# Patient Record
Sex: Male | Born: 1965 | Race: Black or African American | Hispanic: No | Marital: Single | Smoking: Current every day smoker
Health system: Southern US, Community
[De-identification: ages and names within clinical notes are randomized; demographics above are authoritative.]

## PROBLEM LIST (undated history)

## (undated) DIAGNOSIS — R569 Unspecified convulsions: Secondary | ICD-10-CM

## (undated) DIAGNOSIS — J45909 Unspecified asthma, uncomplicated: Secondary | ICD-10-CM

## (undated) DIAGNOSIS — F410 Panic disorder [episodic paroxysmal anxiety] without agoraphobia: Secondary | ICD-10-CM

## (undated) DIAGNOSIS — F419 Anxiety disorder, unspecified: Secondary | ICD-10-CM

## (undated) DIAGNOSIS — F32A Depression, unspecified: Secondary | ICD-10-CM

## (undated) DIAGNOSIS — E785 Hyperlipidemia, unspecified: Secondary | ICD-10-CM

## (undated) DIAGNOSIS — K219 Gastro-esophageal reflux disease without esophagitis: Secondary | ICD-10-CM

## (undated) DIAGNOSIS — I10 Essential (primary) hypertension: Secondary | ICD-10-CM

## (undated) DIAGNOSIS — F329 Major depressive disorder, single episode, unspecified: Secondary | ICD-10-CM

## (undated) HISTORY — DX: Anxiety disorder, unspecified: F41.9

## (undated) HISTORY — DX: Hyperlipidemia, unspecified: E78.5

## (undated) HISTORY — DX: Gastro-esophageal reflux disease without esophagitis: K21.9

---

## 1998-07-25 ENCOUNTER — Emergency Department (HOSPITAL_COMMUNITY): Admission: EM | Admit: 1998-07-25 | Discharge: 1998-07-25 | Payer: Self-pay | Admitting: Emergency Medicine

## 1998-07-25 ENCOUNTER — Encounter: Payer: Self-pay | Admitting: *Deleted

## 1999-02-24 ENCOUNTER — Encounter: Payer: Self-pay | Admitting: Emergency Medicine

## 1999-02-24 ENCOUNTER — Emergency Department (HOSPITAL_COMMUNITY): Admission: EM | Admit: 1999-02-24 | Discharge: 1999-02-24 | Payer: Self-pay | Admitting: Emergency Medicine

## 2011-10-02 ENCOUNTER — Emergency Department (HOSPITAL_COMMUNITY)
Admission: EM | Admit: 2011-10-02 | Discharge: 2011-10-03 | Disposition: A | Discharge status: Home / Self Care | Payer: Medicare Other | Attending: Emergency Medicine | Admitting: Emergency Medicine

## 2011-10-02 ENCOUNTER — Encounter (HOSPITAL_COMMUNITY): Payer: Self-pay | Admitting: Family Medicine

## 2011-10-02 DIAGNOSIS — E119 Type 2 diabetes mellitus without complications: Secondary | ICD-10-CM | POA: Insufficient documentation

## 2011-10-02 DIAGNOSIS — J45909 Unspecified asthma, uncomplicated: Secondary | ICD-10-CM | POA: Insufficient documentation

## 2011-10-02 DIAGNOSIS — R1310 Dysphagia, unspecified: Secondary | ICD-10-CM

## 2011-10-02 DIAGNOSIS — I10 Essential (primary) hypertension: Secondary | ICD-10-CM | POA: Insufficient documentation

## 2011-10-02 DIAGNOSIS — F17200 Nicotine dependence, unspecified, uncomplicated: Secondary | ICD-10-CM | POA: Insufficient documentation

## 2011-10-02 DIAGNOSIS — R1110 Vomiting, unspecified: Secondary | ICD-10-CM | POA: Insufficient documentation

## 2011-10-02 HISTORY — DX: Depression, unspecified: F32.A

## 2011-10-02 HISTORY — DX: Panic disorder (episodic paroxysmal anxiety): F41.0

## 2011-10-02 HISTORY — DX: Major depressive disorder, single episode, unspecified: F32.9

## 2011-10-02 HISTORY — DX: Unspecified asthma, uncomplicated: J45.909

## 2011-10-02 HISTORY — DX: Essential (primary) hypertension: I10

## 2011-10-02 HISTORY — DX: Unspecified convulsions: R56.9

## 2011-10-02 LAB — COMPREHENSIVE METABOLIC PANEL
ALT: 14 U/L (ref 0–53)
AST: 17 U/L (ref 0–37)
Albumin: 4.1 g/dL (ref 3.5–5.2)
Alkaline Phosphatase: 49 U/L (ref 39–117)
BUN: 10 mg/dL (ref 6–23)
CO2: 25 mEq/L (ref 19–32)
Calcium: 9.6 mg/dL (ref 8.4–10.5)
Chloride: 106 mEq/L (ref 96–112)
Creatinine, Ser: 0.86 mg/dL (ref 0.50–1.35)
GFR calc Af Amer: >90 mL/min (ref >90)
GFR calc non Af Amer: >90 mL/min (ref >90)
Glucose, Bld: 90 mg/dL (ref 70–99)
Potassium: 3.9 mEq/L (ref 3.5–5.1)
Sodium: 142 mEq/L (ref 135–145)
Total Bilirubin: 0.2 mg/dL — ABNORMAL LOW (ref 0.3–1.2)
Total Protein: 7.5 g/dL (ref 6.0–8.3)

## 2011-10-02 LAB — CBC WITH DIFFERENTIAL/PLATELET
Basophils Absolute: 0 10*3/uL (ref 0.0–0.1)
Basophils Relative: 0 % (ref 0–1)
Eosinophils Absolute: 0.3 10*3/uL (ref 0.0–0.7)
Eosinophils Relative: 2 % (ref 0–5)
HCT: 43.5 % (ref 39.0–52.0)
Hemoglobin: 15.1 g/dL (ref 13.0–17.0)
Lymphocytes Relative: 44 % (ref 12–46)
Lymphs Abs: 5.5 10*3/uL — ABNORMAL HIGH (ref 0.7–4.0)
MCH: 33.1 pg (ref 26.0–34.0)
MCHC: 34.7 g/dL (ref 30.0–36.0)
MCV: 95.4 fL (ref 78.0–100.0)
Monocytes Absolute: 0.7 10*3/uL (ref 0.1–1.0)
Monocytes Relative: 6 % (ref 3–12)
Neutro Abs: 5.8 10*3/uL (ref 1.7–7.7)
Neutrophils Relative %: 47 % (ref 43–77)
Platelets: 240 10*3/uL (ref 150–400)
RBC: 4.56 MIL/uL (ref 4.22–5.81)
RDW: 12.9 % (ref 11.5–15.5)
WBC: 12.3 10*3/uL — ABNORMAL HIGH (ref 4.0–10.5)

## 2011-10-02 MED ORDER — PANTOPRAZOLE SODIUM 20 MG PO TBEC: 20.0000 mg | DELAYED_RELEASE_TABLET | Freq: Every day | ORAL | Status: AC

## 2011-10-02 MED ORDER — GI COCKTAIL ~~LOC~~
30.0000 mL | Freq: Once | ORAL | Status: AC
  Administered 2011-10-02: 30 mL via ORAL
  Filled 2011-10-02: qty 30

## 2011-10-02 MED ORDER — ONDANSETRON 4 MG PO TBDP
4.0000 mg | ORAL_TABLET | Freq: Once | ORAL | Status: AC
  Administered 2011-10-02: 4 mg via ORAL
  Filled 2011-10-02: qty 1

## 2011-10-02 MED ORDER — METOCLOPRAMIDE HCL 10 MG PO TABS: 10.0000 mg | ORAL_TABLET | Freq: Four times a day (QID) | ORAL | Status: AC

## 2011-10-02 NOTE — ED Notes (Signed)
Per pt he has had epigastric pain for a while but worse over the past week. sts every time he takes a sip of water or any food he vomits it back up. sts BM normal.

## 2011-10-02 NOTE — ED Provider Notes (Signed)
History     CSN: [id number]  Arrival date & time 10/02/11  1158   First MD Initiated Contact with Patient 10/02/11 1414      Patient reports for the last 3 weeks has had increasing nausea and vomiting. Reports everything he eats or drinks is causing to become nauseous and vomit. States now he is also having associated epigastric pain. Reports he is not even able to drink water. Denies currently having abdominal pain, fever, urinary symptoms, diarrhea, back pain, chest pain, shortness of breath. Denies significant history of abdominal illness this or surgeries Patient is a 46 y.o. male presenting with vomiting. The history is provided by the patient.  Emesis  This is a new problem. Episode onset: 3 weeks ago. Episode frequency: every time he eats or drinks. The problem has been gradually worsening. There has been no fever. Associated symptoms include abdominal pain. Pertinent negatives include no chills, no cough, no diarrhea, no fever, no headaches and no URI.    Past Medical History  Diagnosis Date  . Hypertension   . Diabetes mellitus   . Seizures   . Panic attack   . Depression   . Asthma     History reviewed. No pertinent past surgical history.  History reviewed. No pertinent family history.  History  Substance Use Topics  . Smoking status: Current Everyday Smoker  . Smokeless tobacco: Not on file  . Alcohol Use: No      Review of Systems  Constitutional: Negative for fever and chills.  Respiratory: Negative for cough and shortness of breath.   Cardiovascular: Negative for chest pain.  Gastrointestinal: Positive for nausea, vomiting and abdominal pain. Negative for diarrhea, constipation, blood in stool and rectal pain.  Genitourinary: Negative for dysuria, hematuria, flank pain, discharge, penile pain and testicular pain.  Musculoskeletal: Negative for back pain.  Neurological: Negative for dizziness, weakness, numbness and headaches.  All other systems reviewed and  are negative.    Allergies  Penicillins  Home Medications   Current Outpatient Rx  Name Route Sig Dispense Refill  . ALBUTEROL SULFATE HFA 108 (90 BASE) MCG/ACT IN AERS Inhalation Inhale 2 puffs into the lungs every 6 (six) hours as needed.    Marland Kitchen CARVEDILOL PO Oral Take 1 tablet by mouth 2 (two) times daily.    . ENALAPRIL MALEATE PO Oral Take 1 tablet by mouth daily.    Marland Kitchen ADVAIR DISKUS IN Inhalation Inhale 1 puff into the lungs every morning.    Marland Kitchen HYDROCHLOROTHIAZIDE PO Oral Take 1 tablet by mouth daily.    Marland Kitchen METFORMIN HCL PO Oral Take 1 tablet by mouth daily.    Marland Kitchen OVER THE COUNTER MEDICATION Oral Take 2 tablets by mouth daily as needed. For Acid reflux    . PHENYTOIN SODIUM EXTENDED 100 MG PO CAPS Oral Take 300 mg by mouth 2 (two) times daily.    Marland Kitchen PRESCRIPTION MEDICATION Oral Take 1 tablet by mouth 2 (two) times daily as needed. For panic attacks    . PRESCRIPTION MEDICATION Oral Take 1 tablet by mouth daily.      BP 143/91  Pulse 84  Temp 98.2 F (36.8 C) (Oral)  Resp 14  SpO2 98%  Physical Exam  Vitals reviewed. Constitutional: He is oriented to person, place, and time. He appears well-developed and well-nourished.  HENT:  Head: Normocephalic and atraumatic.  Eyes: Conjunctivae are normal. Pupils are equal, round, and reactive to light.  Neck: Normal range of motion. Neck supple.  Cardiovascular: Normal rate,  regular rhythm and normal heart sounds.   Pulmonary/Chest: Effort normal and breath sounds normal.  Abdominal: Soft. Bowel sounds are normal. He exhibits no distension and no mass. There is no tenderness. There is no rebound and no guarding.  Neurological: He is alert and oriented to person, place, and time.  Skin: Skin is warm and dry. No rash noted. No erythema. No pallor.  Psychiatric: He has a normal mood and affect. His behavior is normal.    ED Course  Procedures  Results for orders placed during the hospital encounter of 10/02/11  CBC WITH DIFFERENTIAL        Component Value Range   WBC 12.3 (*) 4.0 - 10.5 K/uL   RBC 4.56  4.22 - 5.81 MIL/uL   Hemoglobin 15.1  13.0 - 17.0 g/dL   HCT 40.9  81.1 - 91.4 %   MCV 95.4  78.0 - 100.0 fL   MCH 33.1  26.0 - 34.0 pg   MCHC 34.7  30.0 - 36.0 g/dL   RDW 78.2  95.6 - 21.3 %   Platelets 240  150 - 400 K/uL   Neutrophils Relative 47  43 - 77 %   Neutro Abs 5.8  1.7 - 7.7 K/uL   Lymphocytes Relative 44  12 - 46 %   Lymphs Abs 5.5 (*) 0.7 - 4.0 K/uL   Monocytes Relative 6  3 - 12 %   Monocytes Absolute 0.7  0.1 - 1.0 K/uL   Eosinophils Relative 2  0 - 5 %   Eosinophils Absolute 0.3  0.0 - 0.7 K/uL   Basophils Relative 0  0 - 1 %   Basophils Absolute 0.0  0.0 - 0.1 K/uL  COMPREHENSIVE METABOLIC PANEL      Component Value Range   Sodium 142  135 - 145 mEq/L   Potassium 3.9  3.5 - 5.1 mEq/L   Chloride 106  96 - 112 mEq/L   CO2 25  19 - 32 mEq/L   Glucose, Bld 90  70 - 99 mg/dL   BUN 10  6 - 23 mg/dL   Creatinine, Ser 0.86  0.50 - 1.35 mg/dL   Calcium 9.6  8.4 - 57.8 mg/dL   Total Protein 7.5  6.0 - 8.3 g/dL   Albumin 4.1  3.5 - 5.2 g/dL   AST 17  0 - 37 U/L   ALT 14  0 - 53 U/L   Alkaline Phosphatase 49  39 - 117 U/L   Total Bilirubin 0.2 (*) 0.3 - 1.2 mg/dL   GFR calc non Af Amer >90  >90 mL/min   GFR calc Af Amer >90  >90 mL/min  LIPASE, BLOOD      Component Value Range   Lipase 24  11 - 59 U/L    MDM     Spoke with Dr. Rhea Belton, he also agrees that patient likely needs an upper endoscopy. Recommends patient be placed in a PPI and have close followup with gastroenterology. Discussed this with patient he voices understanding and is ready for discharge. Advised return if unable to tolerate by mouth fluids. Po trialed and passed     Thomasene Lot, PA-C 10/02/11 1546

## 2011-10-03 NOTE — ED Notes (Signed)
See downtime charting. 

## 2011-10-03 NOTE — ED Provider Notes (Signed)
Medical screening examination/treatment/procedure(s) were performed by non-physician practitioner and as supervising physician I was immediately available for consultation/collaboration.  Juliet Rude. Rubin Payor, MD 10/03/11 1356

## 2011-10-04 ENCOUNTER — Telehealth: Payer: Self-pay | Admitting: *Deleted

## 2011-10-05 NOTE — Telephone Encounter (Signed)
Per Dr Rhea Belton, pt came to the ER and has no GI doc; needs to be established with a doc in our office. I tried all the numbers listed for pt and contacts and they were all out of service or the person who answered did not know the pt. I will send a letter.

## 2011-10-21 ENCOUNTER — Encounter: Payer: Self-pay | Admitting: Gastroenterology

## 2011-11-11 ENCOUNTER — Ambulatory Visit: Payer: Medicare Other | Admitting: Gastroenterology

## 2011-11-11 ENCOUNTER — Telehealth: Payer: Self-pay | Admitting: *Deleted

## 2011-11-11 NOTE — Telephone Encounter (Signed)
Message copied by Arna Snipe on Fri Nov 11, 2011  3:26 PM ------      Message from: Florene Glen      Created: Fri Nov 11, 2011  8:37 AM      Regarding: RE: Question?  This pt can only see Dr Rhea Belton?       Yes, Dr Rhea Belton states he can reestablish with any doc here. Thanks.      ----- Message -----         From: Arna Snipe         Sent: 11/09/2011   2:27 PM           To: Linna Hoff, RN      Subject: Question?  This pt can only see Dr Rhea Belton?

## 2015-09-30 DIAGNOSIS — I152 Hypertension secondary to endocrine disorders: Secondary | ICD-10-CM | POA: Insufficient documentation

## 2015-09-30 DIAGNOSIS — E119 Type 2 diabetes mellitus without complications: Secondary | ICD-10-CM | POA: Insufficient documentation

## 2018-01-22 DIAGNOSIS — F17200 Nicotine dependence, unspecified, uncomplicated: Secondary | ICD-10-CM | POA: Insufficient documentation

## 2021-01-21 ENCOUNTER — Other Ambulatory Visit (HOSPITAL_COMMUNITY): Payer: Self-pay | Admitting: Family Medicine

## 2021-01-21 ENCOUNTER — Other Ambulatory Visit (HOSPITAL_BASED_OUTPATIENT_CLINIC_OR_DEPARTMENT_OTHER): Payer: Self-pay | Admitting: Family Medicine

## 2021-01-21 DIAGNOSIS — F17210 Nicotine dependence, cigarettes, uncomplicated: Secondary | ICD-10-CM

## 2021-04-18 ENCOUNTER — Emergency Department (HOSPITAL_COMMUNITY): Payer: Medicare Other

## 2021-04-18 ENCOUNTER — Inpatient Hospital Stay (HOSPITAL_COMMUNITY)
Admission: EM | Admit: 2021-04-18 | Discharge: 2021-04-19 | DRG: 024 | Discharge status: Against Medical Advice | Payer: Medicare Other | Attending: Neurology | Admitting: Neurology

## 2021-04-18 ENCOUNTER — Emergency Department (HOSPITAL_COMMUNITY): Payer: Medicare Other | Admitting: Anesthesiology

## 2021-04-18 ENCOUNTER — Encounter (HOSPITAL_COMMUNITY)
Admission: EM | Discharge status: Against Medical Advice | Payer: Self-pay | Source: Home / Self Care | Attending: Neurology

## 2021-04-18 ENCOUNTER — Inpatient Hospital Stay (HOSPITAL_COMMUNITY): Payer: Medicare Other

## 2021-04-18 DIAGNOSIS — I679 Cerebrovascular disease, unspecified: Secondary | ICD-10-CM | POA: Diagnosis not present

## 2021-04-18 DIAGNOSIS — I6521 Occlusion and stenosis of right carotid artery: Secondary | ICD-10-CM | POA: Diagnosis not present

## 2021-04-18 DIAGNOSIS — I639 Cerebral infarction, unspecified: Secondary | ICD-10-CM

## 2021-04-18 DIAGNOSIS — R29810 Facial weakness: Secondary | ICD-10-CM | POA: Diagnosis present

## 2021-04-18 DIAGNOSIS — W19XXXA Unspecified fall, initial encounter: Secondary | ICD-10-CM | POA: Diagnosis present

## 2021-04-18 DIAGNOSIS — I63139 Cerebral infarction due to embolism of unspecified carotid artery: Secondary | ICD-10-CM | POA: Diagnosis present

## 2021-04-18 DIAGNOSIS — G8194 Hemiplegia, unspecified affecting left nondominant side: Secondary | ICD-10-CM | POA: Diagnosis present

## 2021-04-18 DIAGNOSIS — K219 Gastro-esophageal reflux disease without esophagitis: Secondary | ICD-10-CM | POA: Diagnosis present

## 2021-04-18 DIAGNOSIS — R29717 NIHSS score 17: Secondary | ICD-10-CM | POA: Diagnosis present

## 2021-04-18 DIAGNOSIS — I63131 Cerebral infarction due to embolism of right carotid artery: Secondary | ICD-10-CM

## 2021-04-18 DIAGNOSIS — E7800 Pure hypercholesterolemia, unspecified: Secondary | ICD-10-CM | POA: Diagnosis not present

## 2021-04-18 DIAGNOSIS — Z7951 Long term (current) use of inhaled steroids: Secondary | ICD-10-CM

## 2021-04-18 DIAGNOSIS — Z7984 Long term (current) use of oral hypoglycemic drugs: Secondary | ICD-10-CM

## 2021-04-18 DIAGNOSIS — E785 Hyperlipidemia, unspecified: Secondary | ICD-10-CM | POA: Diagnosis present

## 2021-04-18 DIAGNOSIS — I6601 Occlusion and stenosis of right middle cerebral artery: Secondary | ICD-10-CM | POA: Diagnosis not present

## 2021-04-18 DIAGNOSIS — I63411 Cerebral infarction due to embolism of right middle cerebral artery: Secondary | ICD-10-CM | POA: Diagnosis not present

## 2021-04-18 DIAGNOSIS — Z20822 Contact with and (suspected) exposure to covid-19: Secondary | ICD-10-CM | POA: Diagnosis present

## 2021-04-18 DIAGNOSIS — E11649 Type 2 diabetes mellitus with hypoglycemia without coma: Secondary | ICD-10-CM | POA: Diagnosis not present

## 2021-04-18 DIAGNOSIS — F32A Depression, unspecified: Secondary | ICD-10-CM | POA: Diagnosis present

## 2021-04-18 DIAGNOSIS — Z716 Tobacco abuse counseling: Secondary | ICD-10-CM

## 2021-04-18 DIAGNOSIS — F17200 Nicotine dependence, unspecified, uncomplicated: Secondary | ICD-10-CM | POA: Diagnosis not present

## 2021-04-18 DIAGNOSIS — E119 Type 2 diabetes mellitus without complications: Secondary | ICD-10-CM | POA: Diagnosis present

## 2021-04-18 DIAGNOSIS — F419 Anxiety disorder, unspecified: Secondary | ICD-10-CM | POA: Diagnosis present

## 2021-04-18 DIAGNOSIS — I10 Essential (primary) hypertension: Secondary | ICD-10-CM | POA: Diagnosis present

## 2021-04-18 DIAGNOSIS — Z79899 Other long term (current) drug therapy: Secondary | ICD-10-CM | POA: Diagnosis not present

## 2021-04-18 DIAGNOSIS — R414 Neurologic neglect syndrome: Secondary | ICD-10-CM | POA: Diagnosis present

## 2021-04-18 DIAGNOSIS — R29710 NIHSS score 10: Secondary | ICD-10-CM | POA: Diagnosis not present

## 2021-04-18 DIAGNOSIS — Z880 Allergy status to penicillin: Secondary | ICD-10-CM | POA: Diagnosis not present

## 2021-04-18 DIAGNOSIS — F410 Panic disorder [episodic paroxysmal anxiety] without agoraphobia: Secondary | ICD-10-CM | POA: Diagnosis present

## 2021-04-18 DIAGNOSIS — R471 Dysarthria and anarthria: Secondary | ICD-10-CM | POA: Diagnosis present

## 2021-04-18 DIAGNOSIS — R451 Restlessness and agitation: Secondary | ICD-10-CM | POA: Diagnosis not present

## 2021-04-18 DIAGNOSIS — F17210 Nicotine dependence, cigarettes, uncomplicated: Secondary | ICD-10-CM | POA: Diagnosis present

## 2021-04-18 DIAGNOSIS — I63511 Cerebral infarction due to unspecified occlusion or stenosis of right middle cerebral artery: Secondary | ICD-10-CM

## 2021-04-18 DIAGNOSIS — I63311 Cerebral infarction due to thrombosis of right middle cerebral artery: Principal | ICD-10-CM | POA: Diagnosis present

## 2021-04-18 HISTORY — PX: RADIOLOGY WITH ANESTHESIA: SHX6223

## 2021-04-18 HISTORY — PX: IR CT HEAD LTD: IMG2386

## 2021-04-18 HISTORY — PX: IR PERCUTANEOUS ART THROMBECTOMY/INFUSION INTRACRANIAL INC DIAG ANGIO: IMG6087

## 2021-04-18 LAB — COMPREHENSIVE METABOLIC PANEL
ALT: 32 U/L (ref 0–44)
AST: 23 U/L (ref 15–41)
Albumin: 3.9 g/dL (ref 3.5–5.0)
Alkaline Phosphatase: 48 U/L (ref 38–126)
Anion gap: 9 (ref 5–15)
BUN: 14 mg/dL (ref 6–20)
CO2: 24 mmol/L (ref 22–32)
Calcium: 9.3 mg/dL (ref 8.9–10.3)
Chloride: 103 mmol/L (ref 98–111)
Creatinine, Ser: 1.11 mg/dL (ref 0.61–1.24)
GFR, Estimated: >60 mL/min (ref >60)
Glucose, Bld: 205 mg/dL — ABNORMAL HIGH (ref 70–99)
Potassium: 4 mmol/L (ref 3.5–5.1)
Sodium: 136 mmol/L (ref 135–145)
Total Bilirubin: 0.7 mg/dL (ref 0.3–1.2)
Total Protein: 7.5 g/dL (ref 6.5–8.1)

## 2021-04-18 LAB — DIFFERENTIAL
Abs Immature Granulocytes: 0.01 10*3/uL (ref 0.00–0.07)
Basophils Absolute: 0 10*3/uL (ref 0.0–0.1)
Basophils Relative: 1 %
Eosinophils Absolute: 0.2 10*3/uL (ref 0.0–0.5)
Eosinophils Relative: 3 %
Immature Granulocytes: 0 %
Lymphocytes Relative: 34 %
Lymphs Abs: 2.6 10*3/uL (ref 0.7–4.0)
Monocytes Absolute: 0.6 10*3/uL (ref 0.1–1.0)
Monocytes Relative: 8 %
Neutro Abs: 4.1 10*3/uL (ref 1.7–7.7)
Neutrophils Relative %: 54 %

## 2021-04-18 LAB — RESP PANEL BY RT-PCR (FLU A&B, COVID) ARPGX2
Influenza A by PCR: NEGATIVE
Influenza B by PCR: NEGATIVE
SARS Coronavirus 2 by RT PCR: NEGATIVE

## 2021-04-18 LAB — CBC
HCT: 43.9 % (ref 39.0–52.0)
Hemoglobin: 14.8 g/dL (ref 13.0–17.0)
MCH: 32.6 pg (ref 26.0–34.0)
MCHC: 33.7 g/dL (ref 30.0–36.0)
MCV: 96.7 fL (ref 80.0–100.0)
Platelets: 203 10*3/uL (ref 150–400)
RBC: 4.54 MIL/uL (ref 4.22–5.81)
RDW: 12.5 % (ref 11.5–15.5)
WBC: 7.5 10*3/uL (ref 4.0–10.5)
nRBC: 0 % (ref 0.0–0.2)

## 2021-04-18 LAB — URINALYSIS, DIPSTICK ONLY
Bilirubin Urine: NEGATIVE
Glucose, UA: 250 mg/dL — AB
Hgb urine dipstick: NEGATIVE
Ketones, ur: NEGATIVE mg/dL
Leukocytes,Ua: NEGATIVE
Nitrite: NEGATIVE
Protein, ur: NEGATIVE mg/dL
Specific Gravity, Urine: <1.005 — ABNORMAL LOW (ref 1.005–1.030)
pH: 8 (ref 5.0–8.0)

## 2021-04-18 LAB — APTT: aPTT: 30 seconds (ref 24–36)

## 2021-04-18 LAB — GLUCOSE, CAPILLARY: Glucose-Capillary: 142 mg/dL — ABNORMAL HIGH (ref 70–99)

## 2021-04-18 LAB — PHOSPHORUS: Phosphorus: 2.5 mg/dL (ref 2.5–4.6)

## 2021-04-18 LAB — PROTIME-INR
INR: 1 (ref 0.8–1.2)
Prothrombin Time: 13.4 seconds (ref 11.4–15.2)

## 2021-04-18 LAB — CBG MONITORING, ED: Glucose-Capillary: 207 mg/dL — ABNORMAL HIGH (ref 70–99)

## 2021-04-18 LAB — I-STAT CHEM 8, ED
BUN: 17 mg/dL (ref 6–20)
Calcium, Ion: 1.03 mmol/L — ABNORMAL LOW (ref 1.15–1.40)
Chloride: 102 mmol/L (ref 98–111)
Creatinine, Ser: 1 mg/dL (ref 0.61–1.24)
Glucose, Bld: 204 mg/dL — ABNORMAL HIGH (ref 70–99)
HCT: 46 % (ref 39.0–52.0)
Hemoglobin: 15.6 g/dL (ref 13.0–17.0)
Potassium: 3.9 mmol/L (ref 3.5–5.1)
Sodium: 136 mmol/L (ref 135–145)
TCO2: 26 mmol/L (ref 22–32)

## 2021-04-18 LAB — ETHANOL: Alcohol, Ethyl (B): <10 mg/dL (ref <10)

## 2021-04-18 LAB — MRSA NEXT GEN BY PCR, NASAL: MRSA by PCR Next Gen: NOT DETECTED

## 2021-04-18 LAB — PHENYTOIN LEVEL, TOTAL: Phenytoin Lvl: <2.5 ug/mL — ABNORMAL LOW (ref 10.0–20.0)

## 2021-04-18 LAB — MAGNESIUM: Magnesium: 1.8 mg/dL (ref 1.7–2.4)

## 2021-04-18 SURGERY — RADIOLOGY WITH ANESTHESIA
Anesthesia: General

## 2021-04-18 MED ORDER — IOHEXOL 350 MG/ML SOLN
40.0000 mL | Freq: Once | INTRAVENOUS | Status: AC | PRN
  Administered 2021-04-18: 40 mL via INTRAVENOUS

## 2021-04-18 MED ORDER — CLEVIDIPINE BUTYRATE 0.5 MG/ML IV EMUL
INTRAVENOUS | Status: DC | PRN
  Administered 2021-04-18: 2 mg/h via INTRAVENOUS

## 2021-04-18 MED ORDER — SUCCINYLCHOLINE CHLORIDE 200 MG/10ML IV SOSY
PREFILLED_SYRINGE | INTRAVENOUS | Status: DC | PRN
Start: 2021-04-18 — End: 2021-04-18
  Administered 2021-04-18: 120 mg via INTRAVENOUS

## 2021-04-18 MED ORDER — ONDANSETRON HCL 4 MG/2ML IJ SOLN
INTRAMUSCULAR | Status: AC
  Administered 2021-04-18: 4 mg via INTRAVENOUS
  Filled 2021-04-18: qty 2

## 2021-04-18 MED ORDER — PHENYLEPHRINE HCL-NACL 20-0.9 MG/250ML-% IV SOLN
INTRAVENOUS | Status: DC | PRN
  Administered 2021-04-18: 30 ug/min via INTRAVENOUS

## 2021-04-18 MED ORDER — GLYCOPYRROLATE 0.2 MG/ML IJ SOLN
INTRAMUSCULAR | Status: DC | PRN
  Administered 2021-04-18: .2 mg via INTRAVENOUS

## 2021-04-18 MED ORDER — ONDANSETRON HCL 4 MG/2ML IJ SOLN
INTRAMUSCULAR | Status: DC | PRN
Start: 2021-04-18 — End: 2021-04-18
  Administered 2021-04-18: 4 mg via INTRAVENOUS

## 2021-04-18 MED ORDER — ACETAMINOPHEN 650 MG RE SUPP: 650.0000 mg | RECTAL | Status: DC | PRN

## 2021-04-18 MED ORDER — CANGRELOR TETRASODIUM 50 MG IV SOLR
INTRAVENOUS | Status: AC
  Filled 2021-04-18: qty 50

## 2021-04-18 MED ORDER — PROPOFOL 10 MG/ML IV BOLUS
INTRAVENOUS | Status: DC | PRN
  Administered 2021-04-18: 130 mg via INTRAVENOUS

## 2021-04-18 MED ORDER — VANCOMYCIN HCL IN DEXTROSE 1-5 GM/200ML-% IV SOLN
INTRAVENOUS | Status: AC
  Filled 2021-04-18: qty 200

## 2021-04-18 MED ORDER — IOHEXOL 300 MG/ML  SOLN
100.0000 mL | Freq: Once | INTRAMUSCULAR | Status: AC | PRN
  Administered 2021-04-18: 50 mL via INTRA_ARTERIAL

## 2021-04-18 MED ORDER — SODIUM CHLORIDE (PF) 0.9 % IJ SOLN
INTRAVENOUS | Status: DC | PRN
  Administered 2021-04-18 (×4): 25 ug via INTRA_ARTERIAL

## 2021-04-18 MED ORDER — TICAGRELOR 90 MG PO TABS: 90.0000 mg | ORAL_TABLET | Freq: Two times a day (BID) | ORAL | Status: DC

## 2021-04-18 MED ORDER — ASPIRIN 81 MG PO CHEW
81.0000 mg | CHEWABLE_TABLET | Freq: Every day | ORAL | Status: DC
  Administered 2021-04-19: 81 mg

## 2021-04-18 MED ORDER — LABETALOL HCL 5 MG/ML IV SOLN
INTRAVENOUS | Status: DC | PRN
  Administered 2021-04-18 (×2): 10 mg via INTRAVENOUS

## 2021-04-18 MED ORDER — ACETAMINOPHEN 160 MG/5ML PO SOLN: 650.0000 mg | ORAL | Status: DC | PRN

## 2021-04-18 MED ORDER — IOHEXOL 300 MG/ML  SOLN
100.0000 mL | Freq: Once | INTRAMUSCULAR | Status: AC | PRN
  Administered 2021-04-18: 60 mL via INTRA_ARTERIAL

## 2021-04-18 MED ORDER — LABETALOL HCL 5 MG/ML IV SOLN
10.0000 mg | INTRAVENOUS | Status: DC | PRN
  Administered 2021-04-18 (×2): 10 mg via INTRAVENOUS
  Filled 2021-04-18 (×2): qty 4

## 2021-04-18 MED ORDER — SODIUM CHLORIDE 0.9 % IV SOLN: INTRAVENOUS | Status: DC

## 2021-04-18 MED ORDER — ASPIRIN 325 MG PO TABS
ORAL_TABLET | ORAL | Status: DC | PRN
  Administered 2021-04-18: 81 mg via ORAL

## 2021-04-18 MED ORDER — LIDOCAINE 2% (20 MG/ML) 5 ML SYRINGE
INTRAMUSCULAR | Status: DC | PRN
  Administered 2021-04-18: 60 mg via INTRAVENOUS

## 2021-04-18 MED ORDER — ACETAMINOPHEN 325 MG PO TABS: 650.0000 mg | ORAL_TABLET | ORAL | Status: DC | PRN

## 2021-04-18 MED ORDER — SUGAMMADEX SODIUM 200 MG/2ML IV SOLN
INTRAVENOUS | Status: DC | PRN
  Administered 2021-04-18 (×2): 100 mg via INTRAVENOUS

## 2021-04-18 MED ORDER — NITROGLYCERIN 1 MG/10 ML FOR IR/CATH LAB
INTRA_ARTERIAL | Status: AC
  Filled 2021-04-18: qty 10

## 2021-04-18 MED ORDER — IOHEXOL 350 MG/ML SOLN
170.0000 mL | Freq: Once | INTRAVENOUS | Status: AC | PRN
  Administered 2021-04-18: 170 mL via INTRAVENOUS

## 2021-04-18 MED ORDER — LACTATED RINGERS IV SOLN: INTRAVENOUS | Status: DC | PRN

## 2021-04-18 MED ORDER — SODIUM CHLORIDE 0.9 % IV SOLN
INTRAVENOUS | Status: DC | PRN
  Administered 2021-04-18: 2 ug/kg/min via INTRAVENOUS

## 2021-04-18 MED ORDER — SODIUM CHLORIDE 0.9% FLUSH: 3.0000 mL | Freq: Once | INTRAVENOUS | Status: DC

## 2021-04-18 MED ORDER — CLEVIDIPINE BUTYRATE 0.5 MG/ML IV EMUL
0.0000 mg/h | INTRAVENOUS | Status: DC
  Administered 2021-04-18 (×3): 21 mg/h via INTRAVENOUS
  Administered 2021-04-19: 8 mg/h via INTRAVENOUS
  Administered 2021-04-19: 20 mg/h via INTRAVENOUS
  Administered 2021-04-19: 18 mg/h via INTRAVENOUS
  Administered 2021-04-19: 20 mg/h via INTRAVENOUS
  Filled 2021-04-18 (×3): qty 100
  Filled 2021-04-18: qty 50
  Filled 2021-04-18 (×3): qty 100

## 2021-04-18 MED ORDER — VANCOMYCIN HCL 1000 MG IV SOLR
INTRAVENOUS | Status: DC | PRN
  Administered 2021-04-18: 1000 mg via INTRAVENOUS

## 2021-04-18 MED ORDER — SENNOSIDES-DOCUSATE SODIUM 8.6-50 MG PO TABS
1.0000 | ORAL_TABLET | Freq: Two times a day (BID) | ORAL | Status: DC
  Filled 2021-04-18: qty 1

## 2021-04-18 MED ORDER — LACTATED RINGERS IV SOLN
INTRAVENOUS | Status: DC | PRN
Start: 2021-04-18 — End: 2021-04-18

## 2021-04-18 MED ORDER — CHLORHEXIDINE GLUCONATE CLOTH 2 % EX PADS: 6.0000 | MEDICATED_PAD | Freq: Every day | CUTANEOUS | Status: DC

## 2021-04-18 MED ORDER — STROKE: EARLY STAGES OF RECOVERY BOOK
Freq: Once | Status: AC
  Filled 2021-04-18: qty 1

## 2021-04-18 MED ORDER — FENTANYL CITRATE (PF) 250 MCG/5ML IJ SOLN
INTRAMUSCULAR | Status: DC | PRN
Start: 2021-04-18 — End: 2021-04-18
  Administered 2021-04-18 (×2): 50 ug via INTRAVENOUS

## 2021-04-18 MED ORDER — TICAGRELOR 90 MG PO TABS
90.0000 mg | ORAL_TABLET | Freq: Two times a day (BID) | ORAL | Status: DC
  Administered 2021-04-19: 90 mg via ORAL
  Filled 2021-04-18: qty 1

## 2021-04-18 MED ORDER — CLEVIDIPINE BUTYRATE 0.5 MG/ML IV EMUL
INTRAVENOUS | Status: AC
  Filled 2021-04-18: qty 50

## 2021-04-18 MED ORDER — TICAGRELOR 60 MG PO TABS
ORAL_TABLET | ORAL | Status: DC | PRN
  Administered 2021-04-18: 180 mg via ORAL

## 2021-04-18 MED ORDER — ASPIRIN 81 MG PO CHEW
CHEWABLE_TABLET | ORAL | Status: AC
  Filled 2021-04-18: qty 1

## 2021-04-18 MED ORDER — ROCURONIUM BROMIDE 10 MG/ML (PF) SYRINGE
PREFILLED_SYRINGE | INTRAVENOUS | Status: DC | PRN
  Administered 2021-04-18: 20 mg via INTRAVENOUS
  Administered 2021-04-18: 60 mg via INTRAVENOUS

## 2021-04-18 MED ORDER — CANGRELOR BOLUS VIA INFUSION
INTRAVENOUS | Status: DC | PRN
  Administered 2021-04-18: 1317 ug via INTRAVENOUS

## 2021-04-18 MED ORDER — HEPARIN SODIUM (PORCINE) 5000 UNIT/ML IJ SOLN
5000.0000 [IU] | Freq: Three times a day (TID) | INTRAMUSCULAR | Status: DC
  Administered 2021-04-18 – 2021-04-19 (×2): 5000 [IU] via SUBCUTANEOUS
  Filled 2021-04-18 (×3): qty 1

## 2021-04-18 MED ORDER — DEXAMETHASONE SODIUM PHOSPHATE 10 MG/ML IJ SOLN
INTRAMUSCULAR | Status: DC | PRN
Start: 2021-04-18 — End: 2021-04-18
  Administered 2021-04-18: 5 mg via INTRAVENOUS

## 2021-04-18 MED ORDER — ASPIRIN 81 MG PO CHEW
81.0000 mg | CHEWABLE_TABLET | Freq: Every day | ORAL | Status: DC
  Filled 2021-04-18: qty 1

## 2021-04-18 MED ORDER — TICAGRELOR 90 MG PO TABS
ORAL_TABLET | ORAL | Status: AC
  Filled 2021-04-18: qty 2

## 2021-04-18 MED ORDER — PHENYLEPHRINE HCL-NACL 20-0.9 MG/250ML-% IV SOLN: INTRAVENOUS | Status: DC | PRN

## 2021-04-18 MED ORDER — ONDANSETRON HCL 4 MG/2ML IJ SOLN: 4.0000 mg | Freq: Once | INTRAMUSCULAR | Status: AC

## 2021-04-18 NOTE — ED Provider Notes (Signed)
[facility] EMERGENCY DEPARTMENT Provider Note   CSN: [id number] Arrival date & time: 04/18/21  1517  An emergency department physician performed an initial assessment on this suspected stroke patient at 39.  History  Chief Complaint  Patient presents with   Code Stroke    George Joyce is a 56 y.o. male.  HPI Patient presented as a code stroke.  Last normal unknown.  History of hypertension.  Initially unable to provide past history.  Reportedly at 10:00 had some weirdness on his left side.  Later had a fall.  Unknown last normal.  Slurred speech with left-sided weakness.  Left-sided facial droop.  Will not look to the left.  Met at bridge by myself and neurology, Dr. Theda Sers   Past Medical History:  Diagnosis Date   Anxiety    Asthma    Depression    Diabetes mellitus    Dyslipidemia    GERD (gastroesophageal reflux disease)    Hypertension    Panic attack    Seizures ([place])     Home Medications Prior to Admission medications   Medication Sig Start Date End Date Taking? Authorizing Provider  albuterol (PROVENTIL HFA;VENTOLIN HFA) 108 (90 BASE) MCG/ACT inhaler Inhale 2 puffs into the lungs every 6 (six) hours as needed.    [provider]  CARVEDILOL PO Take 1 tablet by mouth 2 (two) times daily.    [provider]  ENALAPRIL MALEATE PO Take 1 tablet by mouth daily.    [provider]  Fluticasone-Salmeterol (ADVAIR DISKUS IN) Inhale 1 puff into the lungs every morning.    [provider]  HYDROCHLOROTHIAZIDE PO Take 1 tablet by mouth daily.    [provider]  METFORMIN HCL PO Take 1 tablet by mouth daily.    [provider]  metoCLOPramide (REGLAN) 10 MG tablet Take 1 tablet (10 mg total) by mouth every 6 (six) hours. 10/02/11 10/12/11  Domingo Dimes, PA-C  OVER THE COUNTER MEDICATION Take 2 tablets by mouth daily as needed. For Acid reflux    [provider]  pantoprazole (PROTONIX) 20  MG tablet Take 1 tablet (20 mg total) by mouth daily. 10/02/11 10/01/12  Domingo Dimes, PA-C  phenytoin (DILANTIN) 100 MG ER capsule Take 300 mg by mouth 2 (two) times daily.    [provider]  PRESCRIPTION MEDICATION Take 1 tablet by mouth 2 (two) times daily as needed. For panic attacks    [provider]  PRESCRIPTION MEDICATION Take 1 tablet by mouth daily.    [provider]      Allergies    Penicillins    Review of Systems   Review of Systems  Constitutional:  Negative for appetite change.  Respiratory:  Negative for shortness of breath.   Gastrointestinal:  Negative for abdominal pain.   Physical Exam Updated Vital Signs BP (!) 176/93    Pulse 84    Resp 18    Wt 87.8 kg    SpO2 100%  Physical Exam Vitals and nursing note reviewed.  Eyes:     Comments: Eyes will not look to left.  Cardiovascular:     Rate and Rhythm: Regular rhythm.  Pulmonary:     Breath sounds: No wheezing or rhonchi.  Musculoskeletal:     Cervical back: Neck supple.  Skin:    General: Skin is warm.     Capillary Refill: Capillary refill takes less than 2 seconds.  Neurological:     Mental Status: He  is alert.     Comments: Awake and answers questions.  States history of hypertension.  Eyes would not look to left.  Left-sided facial droop.  Is moving left arm but not as much as left.  Not moving left lower extremity.  States that he feels as if his left arm does not belong to him although he is moving it.    ED Results / Procedures / Treatments   Labs (all labs ordered are listed, but only abnormal results are displayed) Labs Reviewed  COMPREHENSIVE METABOLIC PANEL - Abnormal; Notable for the following components:      Result Value   Glucose, Bld 205 (*)    All other components within normal limits  I-STAT CHEM 8, ED - Abnormal; Notable for the following components:   Glucose, Bld 204 (*)    Calcium, Ion 1.03 (*)    All other components within normal limits  CBG  MONITORING, ED - Abnormal; Notable for the following components:   Glucose-Capillary 207 (*)    All other components within normal limits  RESP PANEL BY RT-PCR (FLU A&B, COVID) ARPGX2  PROTIME-INR  APTT  CBC  DIFFERENTIAL    EKG None  Radiology CT CEREBRAL PERFUSION W CONTRAST  Result Date: 04/18/2021 CLINICAL DATA:  Stroke. EXAM: CT PERFUSION BRAIN TECHNIQUE: Multiphase CT imaging of the brain was performed following IV bolus contrast injection. Subsequent parametric perfusion maps were calculated using RAPID software. RADIATION DOSE REDUCTION: This exam was performed according to the departmental dose-optimization program which includes automated exposure control, adjustment of the mA and/or kV according to patient size and/or use of iterative reconstruction technique. CONTRAST:  31m OMNIPAQUE IOHEXOL 350 MG/ML SOLN COMPARISON:  Noncontrast head CT and CT angiogram head/neck performed earlier today. FINDINGS: CT Brain Perfusion Findings: CBF (<30%) Volume: 181mPerfusion (Tmax>6.0s) volume: 9064mismatch Volume: 105m74mfarct Core: 17 mL Infarction Location:Right MCA vascular territory. These results were communicated to Dr. CollTheda Sers4:14 pmon 2/5/2023by text page via the [facility] system. IMPRESSION: The perfusion software identifies hypoperfused parenchyma within the right MCA vascular territory totaling 90 mL (utilizing the Tmax>6 seconds threshold). 17 mL core infarct within the right MCA vascular territory. Reported mismatch: 73 mL. Electronically Signed   By: KyleKellie Simmering.   On: 04/18/2021 16:17   CT HEAD CODE STROKE WO CONTRAST  Result Date: 04/18/2021 CLINICAL DATA:  Code stroke. Provided history: Neuro deficit, acute, stroke suspected. Additional history provided: Left-sided weakness, slurred speech, neglect. EXAM: CT HEAD WITHOUT CONTRAST TECHNIQUE: Contiguous axial images were obtained from the base of the skull through the vertex without intravenous contrast. RADIATION  DOSE REDUCTION: This exam was performed according to the departmental dose-optimization program which includes automated exposure control, adjustment of the mA and/or kV according to patient size and/or use of iterative reconstruction technique. COMPARISON:  No pertinent prior exams available for comparison. FINDINGS: Brain: Cerebral volume is normal. Partially empty sella turcica. There is no acute intracranial hemorrhage. No demarcated cortical infarct. No extra-axial fluid collection. No evidence of an intracranial mass. No midline shift. Vascular: A dense M1/M2 right MCA vessel is questioned (series 2, image 12). Atherosclerotic calcifications. Skull: Normal. Negative for fracture or focal lesion. Sinuses/Orbits: Visualized orbits show no acute finding. Moderate mucosal thickening within the bilateral ethmoid sinuses. Large left maxillary sinus mucous retention cyst. ASPECTS ([place] [facility] Early CT Score) - Ganglionic level infarction (caudate, lentiform nuclei, internal capsule, insula, M1-M3 cortex): 7 - Supraganglionic infarction (M4-M6 cortex): 3 Total score (0-10 with 10  being normal): 10 These results were communicated to Dr. Theda Sers At 3:35 pmon 2/5/2023by text page via the [facility] messaging system. IMPRESSION: No acute intracranial hemorrhage or acute demarcated cortical infarction. A dense M1/M2 right MCA is questioned, suspicious for endoluminal thrombus. Correlate with pending CT angiogram head/neck. Paranasal sinus disease, as described. Electronically Signed   By: Kellie Simmering D.O.   On: 04/18/2021 15:35   CT ANGIO HEAD NECK W WO CM W PERF (CODE STROKE)  Result Date: 04/18/2021 CLINICAL DATA:  Provided history: Neuro deficit, acute, stroke suspected; left-sided weakness, slurred speech and intermittent neglect. EXAM: CT ANGIOGRAPHY HEAD AND NECK TECHNIQUE: Multidetector CT imaging of the head and neck was performed using the standard protocol during bolus administration of intravenous  contrast. Multiplanar CT image reconstructions and MIPs were obtained to evaluate the vascular anatomy. Carotid stenosis measurements (when applicable) are obtained utilizing NASCET criteria, using the distal internal carotid diameter as the denominator. RADIATION DOSE REDUCTION: This exam was performed according to the departmental dose-optimization program which includes automated exposure control, adjustment of the mA and/or kV according to patient size and/or use of iterative reconstruction technique. CONTRAST:  Administered contrast not known at this time. COMPARISON:  Noncontrast head CT performed immediately prior. FINDINGS: CTA NECK FINDINGS Aortic arch: Common origin of the innominate and left common carotid arteries. Atherosclerotic plaque within the proximal major branch vessels of the neck. No hemodynamically significant innominate or proximal subclavian artery stenosis. Right carotid system: The common carotid artery is patent. Atherosclerotic plaque within the distal CCA, about the carotid bifurcation and within the proximal ICA. 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 Left carotid system: CCA and ICA patent within the neck without significant stenosis (50% or greater). Atherosclerotic plaque within the left carotid system within the neck, most notably about the carotid bifurcation and within the proximal ICA. Vertebral arteries: Vertebral arteries patent within the neck without significant stenosis. Skeleton: Reversal of the expected cervical lordosis. Cervical spondylosis. No acute bony abnormality or aggressive osseous lesion. Other neck: No neck mass or cervical lymphadenopathy. Upper chest: No consolidation within the imaged lung apices. Review of the MIP images confirms the above findings CTA HEAD FINDINGS Anterior circulation: The right ICA remains occluded at the precavernous and cavernous levels. There is  reconstitution of enhancement within the paraclinoid/supraclinoid right ICA. The intracranial left ICA is patent. Atherosclerotic plaque within this vessel with no more than mild stenosis. The M1 middle cerebral arteries are patent. Severe stenosis at the origin of an M2 right MCA vessel (series 7, image image 113). This may reflect the presence of endoluminal thrombus at this site. No right M2 proximal branch occlusion is identified. No left M2 proximal branch occlusion or high-grade proximal stenosis is identified. The anterior cerebral arteries are patent. 2 mm inferior projecting vascular protrusion arising from the paraclinoid left ICA, which may reflect an aneurysm or infundibulum (series 7, image 121) (series 11, image 107). Posterior circulation: The intracranial vertebral arteries are patent. The basilar artery is patent. The posterior cerebral arteries are patent. Severe stenosis within the P2 right PCA (series 8, image 23) (moderate stenosis within the P2 left PCA. Posterior communicating arteries are diminutive or absent bilaterally. Venous sinuses: Within the limitations of contrast timing, no convincing thrombus. Anatomic variants: As described. Review of the MIP images confirms the above findings Attempts are being made to reach the ordering provider at this time. IMPRESSION: CTA neck: 1. The right internal  carotid artery becomes occluded shortly beyond its origin. There is a somewhat tapered appearance leading up to the occlusion, and this may be secondary to dissection. 2. The left common carotid, left cervical internal carotid and bilateral vertebral arteries are patent within the neck without hemodynamically significant stenosis. Atherosclerotic plaque within the left carotid system, as described. CTA head: 1. 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 There is reconstitution of enhancement within the paraclinoid and supraclinoid right ICA. 2.  Severe stenosis at the origin of an M2 right MCA vessel. There is an irregular appearance of the vessel at this site, and findings may reflect the presence of endoluminal thrombus. 3. Severe stenosis within the P2 right PCA. 4. Moderate stenosis within the P2 left PCA. 5. 2 mm inferiorly projecting vascular protrusion arising from the paraclinoid left ICA, which may reflect an aneurysm or infundibulum. Electronically Signed   By: Kellie Simmering D.O.   On: 04/18/2021 15:53    Procedures Procedures    Medications Ordered in ED Medications  sodium chloride flush (NS) 0.9 % injection 3 mL (3 mLs Intravenous Not Given 04/18/21 1543)  ondansetron (ZOFRAN) injection 4 mg (4 mg Intravenous Given 04/18/21 1533)  iohexol (OMNIPAQUE) 350 MG/ML injection 40 mL (40 mLs Intravenous Contrast Given 04/18/21 1606)  iohexol (OMNIPAQUE) 350 MG/ML injection 170 mL (170 mLs Intravenous Contrast Given 04/18/21 1612)    ED Course/ Medical Decision Making/ A&P                           Medical Decision Making I have independently interpreted the imaging.  Problems Addressed: Cerebrovascular accident (CVA), unspecified mechanism ([place]): acute illness or injury that poses a threat to life or bodily functions  Amount and/or Complexity of Data Reviewed External Data Reviewed: notes. Labs: ordered. Decision-making details documented in ED Course. Radiology: ordered and independent interpretation performed.  Risk Decision regarding hospitalization.  Critical Care Total time providing critical care: 30-74 minutes  Patient presented as a code stroke.  History of hypertension.  Otherwise unable to provide much history.  CBG reassuring.  Blood pressure mildly elevated but not severely so.  Difficulty with last normal.  Met by neurology myself.  Does have debilitating deficits which NIH score of 17.  Initial head CT overall reassuring but may have some MCA abnormality.  CTA does show internal carotid cutoff.  Perfusion does  show mismatch.  Likely will be taken to interventional radiology.  I have independently interpreted the CT scans and perfusion scan.  CRITICAL CARE Performed by: Davonna Belling Total critical care time: 30 minutes Critical care time was exclusive of separately billable procedures and treating other patients. Critical care was necessary to treat or prevent imminent or life-threatening deterioration. Critical care was time spent personally by me on the following activities: development of treatment plan with patient and/or surrogate as well as nursing, discussions with consultants, evaluation of patient's response to treatment, examination of patient, obtaining history from patient or surrogate, ordering and performing treatments and interventions, ordering and review of laboratory studies, ordering and review of radiographic studies, pulse oximetry and re-evaluation of patient's condition.           Final Clinical Impression(s) / ED Diagnoses Final diagnoses:  None    Rx / DC Orders ED Discharge Orders     None         Davonna Belling, MD 04/19/21 [phone]

## 2021-04-18 NOTE — ED Triage Notes (Signed)
Pt arrives via [facility] as code stroke. Pt started having "left side weirdness" at 1000, pt went to get up at 1430 and couldn't feel his L leg causing him to fall. Upon ems arrival pt has slurred speech, L side weakness, R gaze, L side facial droop. 16g RAC, CBG 197, 180/110, 110 HR, 100% RA. Hx HTN.

## 2021-04-18 NOTE — Progress Notes (Signed)
Discussed with Dr. Russella Dar regarding discontinuation of Cangrelor gtt, was given verbal order to d/c it now.

## 2021-04-18 NOTE — Consult Note (Signed)
Neurology Consult H&P  George Joyce MR# [id number] 04/18/2021   CC: code stroke  History is obtained from: patient,  EMS and chart.  HPI: George Joyce is a 56 y.o. male PMHx as reviewed below developed strange sensation in his left hand about 1000 today which he called weirdness possibly not recognizing his left arm. He went to get up at 1430 and couldn't feel his left leg and he fell. EMS was called and on arrival noted slurred speech, L side weakness, R gaze, L side facial droop.  LKW: unclear tNK given: No unclear onset IR Thrombectomy yes Modified Rankin Scale: 0-Completely asymptomatic and back to baseline post- stroke NIHSS: 17 which improved to 10  ROS: A complete ROS was performed and is negative except as noted in the HPI.  Past Medical History:  Diagnosis Date   Anxiety    Asthma    Depression    Diabetes mellitus    Dyslipidemia    GERD (gastroesophageal reflux disease)    Hypertension    Panic attack    Seizures ([place])    No family history on file.  Social History:  reports that he has been smoking. He does not have any smokeless tobacco history on file. He reports that he does not drink alcohol. No history on file for drug use.   Prior to Admission medications   Medication Sig Start Date End Date Taking? Authorizing Provider  albuterol (PROVENTIL HFA;VENTOLIN HFA) 108 (90 BASE) MCG/ACT inhaler Inhale 2 puffs into the lungs every 6 (six) hours as needed.    [provider]  CARVEDILOL PO Take 1 tablet by mouth 2 (two) times daily.    [provider]  ENALAPRIL MALEATE PO Take 1 tablet by mouth daily.    [provider]  Fluticasone-Salmeterol (ADVAIR DISKUS IN) Inhale 1 puff into the lungs every morning.    [provider]  HYDROCHLOROTHIAZIDE PO Take 1 tablet by mouth daily.    [provider]  METFORMIN HCL PO Take 1 tablet by mouth daily.    [provider]  metoCLOPramide (REGLAN) 10 MG tablet Take 1  tablet (10 mg total) by mouth every 6 (six) hours. 10/02/11 10/12/11  Domingo Dimes, PA-C  OVER THE COUNTER MEDICATION Take 2 tablets by mouth daily as needed. For Acid reflux    [provider]  pantoprazole (PROTONIX) 20 MG tablet Take 1 tablet (20 mg total) by mouth daily. 10/02/11 10/01/12  Domingo Dimes, PA-C  phenytoin (DILANTIN) 100 MG ER capsule Take 300 mg by mouth 2 (two) times daily.    [provider]  PRESCRIPTION MEDICATION Take 1 tablet by mouth 2 (two) times daily as needed. For panic attacks    [provider]  PRESCRIPTION MEDICATION Take 1 tablet by mouth daily.    [provider]    Exam: Current vital signs: There were no vitals taken for this visit.  Physical Exam  Constitutional: Appears well-developed and well-nourished.  Psych: Affect appropriate to situation Eyes: No scleral injection HENT: No OP obstruction. Head: Normocephalic.  Cardiovascular: Normal rate and regular rhythm.  Respiratory: Effort normal, symmetric excursions bilaterally, no audible wheezing. GI: Soft.  No distension. There is no tenderness.  Skin: WDI  Neuro: Mental Status: Patient is awake, alert, oriented to person, place, month, year, and situation. Patient is not able to give a clear and coherent history. Speech dysarthric with impaired fluency, intact comprehension and impaired repetition. No neglect Visual Fields decreased on left. Pupils are  equal, round, and reactive to light. Right gaze deviation which can be forced to midline with head turn, without ptosis.  Facial sensation is symmetric to temperature Facial movement is weak in lower left.  Hearing is intact to voice. Uvula midline and palate elevates symmetrically. Shoulder shrug is symmetric. Tongue is midline without atrophy or fasciculations.  Tone is normal. Bulk is normal. 4+/5 strength in left arm and leg. Sensation is symmetric to light touch and temperature in the arms and  legs. Deep Tendon Reflexes: 2+ and symmetric in the biceps and patellae. Toes are downgoing bilaterally. FNF and HKS are intact on right. Gait - Deferred  I have reviewed labs in epic and the pertinent results are: No labs available at time of evaluation  I have reviewed the images obtained: NCT head showed no acute intracranial hemorrhage or acute demarcated cortical infarction. CTA head and neck showed the right internal carotid artery remains occluded at the cavernous and pre-cavernous levels. There is reconstitution of enhancement within the paraclinoid and supraclinoid right ICA. Severe stenosis at the origin of an M2 right MCA vessel. There is an irregular appearance of the vessel at this site, and findings may reflect the presence of endoluminal thrombus. Severe stenosis within the P2 right PCA. Moderate stenosis within the P2 left PCA. 2 mm inferiorly projecting vascular protrusion arising from the paraclinoid left ICA, which may reflect an aneurysm or infundibulum. 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 There is a somewhat tapered appearance leading up to the occlusion, and this may be secondary to dissection.  CTP showed hypoperfused parenchyma within the right MCA vascular territory totaling 90 mL (utilizing the Tmax>6 seconds threshold). 17 mL core infarct within the right MCA vascular territory. Reported mismatch: 73 mL. MMR 5.3  Assessment: George Joyce is a 56 y.o. 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 CT scanner malfunctioned during attempted perfusion and system had to be rebooted. Patient was transferred to another scanner and underwent perfusion scan which showed favorable profile. Discussed with NeuroIR and he was deemed a good candidate for intervention.  There was some difficulty contacting family members on multiple attempts of calling and an emergency consent was  signed.  Plan: Admit to NICU for post thrombectomy management per protocol. Close surveillance of neurological status. Hip straight x 6 hrs Monitor groin puncture site for pulse quality, limb temperature, signs of neurovascular compromise.  Antiplatelets per neuro IR  Blood pressure: MAP >65 SBP 140-160: - Start nicardipine infusion may increase to maximum 31mg/min as needed to maintain SBP<120 -160. - Labetalol 282mevery 10 minutes as needed if SBP>140.  Labs: type and cross, CBC, CMP, Mg, phos, troponin, HbA1c, lipid panel.  Maintain O2 sats > 94% Normothermia - For temperature >37.5C - acetaminophen 6503m4-6 hours PRN. Gentle IV hydration. CT head 6 hours post procedure or per protocol.  MRI brain without contrast when able. Relative euglycemia and treat if hyperglycemia (>200 mg/dL)/hypoglycemia (< 39m25m). TTE. Statin if LDL > 70 Telemetry monitoring for arrhythmia. Recommend Stroke education. Recommend PT/OT/SLP consult. If there is acute neurologic decline STAT CT head.   PPx:     GI - H2, docusate 400mg70m.     DVT - heparin subq and SCDs. Precautions: Aspiration/seizure/fall  This patient is critically ill and at significant risk of neurological worsening, death and care requires constant monitoring of vital signs, hemodynamics,respiratory and cardiac monitoring, neurological assessment, discussion with family, other  specialists and medical decision making of high complexity. I spent 125 minutes of neurocritical care time  in the care of  this patient. This was time spent independent of any time provided by nurse practitioner or PA.  Electronically signed by:  Lynnae Sandhoff, MD Page: [phone] 04/18/2021, 3:24 PM  If 7pm- 7am, please page neurology on call as listed in [place].

## 2021-04-18 NOTE — Transfer of Care (Signed)
Immediate Anesthesia Transfer of Care Note  Patient: George Joyce  Procedure(s) Performed: RADIOLOGY WITH ANESTHESIA  Patient Location: PACU and ICU  Anesthesia Type:General  Level of Consciousness: awake, alert  and drowsy  Airway & Oxygen Therapy: Patient Spontanous Breathing and Patient connected to face mask oxygen  Post-op Assessment: Report given to RN and Post -op Vital signs reviewed and stable  Post vital signs: Reviewed and stable  Last Vitals:  Vitals Value Taken Time  BP 143/77   Temp    Pulse 83 04/18/21 1943  Resp 16 04/18/21 1943  SpO2 100 % 04/18/21 1943  Vitals shown include unvalidated device data.  Last Pain: There were no vitals filed for this visit.       Complications: No notable events documented.

## 2021-04-18 NOTE — Anesthesia Procedure Notes (Signed)
Arterial Line Insertion Start/End2/07/2021 5:04 PM, 04/18/2021 5:09 PM Performed by: Marcene Duos, MD, anesthesiologist  Patient location: Pre-op. Preanesthetic checklist: patient identified, IV checked, site marked, risks and benefits discussed, surgical consent, monitors and equipment checked, pre-op evaluation, timeout performed and anesthesia consent Lidocaine 1% used for infiltration Left, radial was placed Catheter size: 20 G Hand hygiene performed  and maximum sterile barriers used   Attempts: 1 Procedure performed without using ultrasound guided technique. Following insertion, dressing applied and Biopatch. Post procedure assessment: normal and unchanged  Patient tolerated the procedure well with no immediate complications.

## 2021-04-18 NOTE — H&P (Signed)
°  Please see my consult note today  Rica Mote, MD 04/18/2021 7:54 PM

## 2021-04-18 NOTE — Progress Notes (Signed)
Patient ID: George Joyce, male   DOB: 08-24-1965, 56 y.o.   MRN: [id number] INR. 55 yr RT H M LSW  10 am  New onset of left UE numbnes with left sided hemiplegia,RT gaze deviation and slurred speech. CT Brain No ICH ASPECTS 10 . CTA occluded RT ICA prox and distal RT MCA branch. CTP core of 17cc v Tmax> 6s of 90 ML Ratio of 5..3. Endovascular treatment D/W daughter. Reasons,procedure and alternatives reviewed with daughters. Risks of ICH of 10  % with worsening neuro function ,death and inability to revascularize discussed.  They expressed understanding and provided consent to proceed. S.Gicela Schwarting MD

## 2021-04-18 NOTE — Anesthesia Preprocedure Evaluation (Signed)
Anesthesia Evaluation  Patient identified by MRN, date of birth, ID band Patient confused  Preop documentation limited or incomplete due to emergent nature of procedure.  Airway Mallampati: II  TM Distance: >3 FB Neck ROM: Full    Dental   Pulmonary asthma ,    breath sounds clear to auscultation       Cardiovascular hypertension,  Rhythm:Regular Rate:Normal     Neuro/Psych Seizures -,  CVA    GI/Hepatic Neg liver ROS, GERD  ,  Endo/Other  diabetes, Type 2, Oral Hypoglycemic Agents  Renal/GU negative Renal ROS     Musculoskeletal   Abdominal   Peds  Hematology negative hematology ROS (+)   Anesthesia Other Findings   Reproductive/Obstetrics                             Lab Results  Component Value Date   WBC 7.5 04/18/2021   HGB 15.6 04/18/2021   HCT 46.0 04/18/2021   MCV 96.7 04/18/2021   PLT 203 04/18/2021   Lab Results  Component Value Date   CREATININE 1.00 04/18/2021   BUN 17 04/18/2021   NA 136 04/18/2021   K 3.9 04/18/2021   CL 102 04/18/2021   CO2 24 04/18/2021    Anesthesia Physical Anesthesia Plan  ASA: 4 and emergent  Anesthesia Plan: General   Post-op Pain Management: Minimal or no pain anticipated   Induction: Intravenous and Rapid sequence  PONV Risk Score and Plan: 2 and Dexamethasone, Ondansetron and Treatment may vary due to age or medical condition  Airway Management Planned: Oral ETT  Additional Equipment: Arterial line  Intra-op Plan:   Post-operative Plan: Possible Post-op intubation/ventilation  Informed Consent: I have reviewed the patients History and Physical, chart, labs and discussed the procedure including the risks, benefits and alternatives for the proposed anesthesia with the patient or authorized representative who has indicated his/her understanding and acceptance.     Dental advisory given  Plan Discussed with: CRNA  Anesthesia  Plan Comments:         Anesthesia Quick Evaluation

## 2021-04-18 NOTE — Anesthesia Procedure Notes (Signed)
Procedure Name: Intubation Date/Time: 04/18/2021 5:00 PM Performed by: Trinna Post., CRNA Pre-anesthesia Checklist: Patient identified, Emergency Drugs available, Suction available, Patient being monitored and Timeout performed Patient Re-evaluated:Patient Re-evaluated prior to induction Oxygen Delivery Method: Circle system utilized Preoxygenation: Pre-oxygenation with 100% oxygen Induction Type: IV induction, Rapid sequence and Cricoid Pressure applied Laryngoscope Size: Mac and 4 Grade View: Grade I Tube type: Oral Tube size: 7.5 mm Number of attempts: 1 Airway Equipment and Method: Stylet Placement Confirmation: ETT inserted through vocal cords under direct vision, positive ETCO2 and breath sounds checked- equal and bilateral Secured at: 22 cm Tube secured with: Tape Dental Injury: Teeth and Oropharynx as per pre-operative assessment

## 2021-04-18 NOTE — Procedures (Signed)
INR.  S/P RT common carotid arterriogram RT CFA approach . Findings .  Occluded Rt ICA prox and distally. Distal RT MCA M1 and prox sup and Infer division near occlusive thrombus.  S/P complete revascularization of distal  Rt MCA  and MCA bifurcation thrombus with x1 pass with solitaire20mmx 40 MM X retriever and contacta aspiration achieving a TICI 2C revascularization.  S/O stent assisted angioplasty of prox Rt ICA with distal protection . Post CT no ICH Only mild contrast stain in the RT parietal subcortical region. 56F angioseal for hemostasis of RT CFA puncture site. Distal pulses all intact. D/C IV cangrelor infusion at 10.50 PM followed by immediate CT brain and page neurohospitalist on call. . D/W Dr Enid Baas.Rahel Carlton MD

## 2021-04-18 NOTE — Sedation Documentation (Signed)
SBAR given to [employer], Charity fundraiser at beside. All questions answered to satisfaction.

## 2021-04-19 ENCOUNTER — Encounter (HOSPITAL_COMMUNITY): Payer: Self-pay | Admitting: Radiology

## 2021-04-19 ENCOUNTER — Inpatient Hospital Stay (HOSPITAL_COMMUNITY): Payer: Medicare Other

## 2021-04-19 DIAGNOSIS — F17200 Nicotine dependence, unspecified, uncomplicated: Secondary | ICD-10-CM

## 2021-04-19 DIAGNOSIS — I6601 Occlusion and stenosis of right middle cerebral artery: Secondary | ICD-10-CM

## 2021-04-19 DIAGNOSIS — E7800 Pure hypercholesterolemia, unspecified: Secondary | ICD-10-CM

## 2021-04-19 DIAGNOSIS — E11649 Type 2 diabetes mellitus with hypoglycemia without coma: Secondary | ICD-10-CM

## 2021-04-19 LAB — CBC WITH DIFFERENTIAL/PLATELET
Abs Immature Granulocytes: 0.02 10*3/uL (ref 0.00–0.07)
Basophils Absolute: 0 10*3/uL (ref 0.0–0.1)
Basophils Relative: 0 %
Eosinophils Absolute: 0 10*3/uL (ref 0.0–0.5)
Eosinophils Relative: 0 %
HCT: 38.1 % — ABNORMAL LOW (ref 39.0–52.0)
Hemoglobin: 13.3 g/dL (ref 13.0–17.0)
Immature Granulocytes: 0 %
Lymphocytes Relative: 16 %
Lymphs Abs: 1.2 10*3/uL (ref 0.7–4.0)
MCH: 33 pg (ref 26.0–34.0)
MCHC: 34.9 g/dL (ref 30.0–36.0)
MCV: 94.5 fL (ref 80.0–100.0)
Monocytes Absolute: 0.2 10*3/uL (ref 0.1–1.0)
Monocytes Relative: 3 %
Neutro Abs: 5.9 10*3/uL (ref 1.7–7.7)
Neutrophils Relative %: 81 %
Platelets: 236 10*3/uL (ref 150–400)
RBC: 4.03 MIL/uL — ABNORMAL LOW (ref 4.22–5.81)
RDW: 12.6 % (ref 11.5–15.5)
WBC: 7.3 10*3/uL (ref 4.0–10.5)
nRBC: 0 % (ref 0.0–0.2)

## 2021-04-19 LAB — LIPID PANEL
Cholesterol: 238 mg/dL — ABNORMAL HIGH (ref 0–200)
HDL: 38 mg/dL — ABNORMAL LOW (ref >40)
LDL Cholesterol: 155 mg/dL — ABNORMAL HIGH (ref 0–99)
Total CHOL/HDL Ratio: 6.3 RATIO
Triglycerides: 223 mg/dL — ABNORMAL HIGH (ref <150)
VLDL: 45 mg/dL — ABNORMAL HIGH (ref 0–40)

## 2021-04-19 LAB — BASIC METABOLIC PANEL
Anion gap: 11 (ref 5–15)
BUN: 8 mg/dL (ref 6–20)
CO2: 21 mmol/L — ABNORMAL LOW (ref 22–32)
Calcium: 8.7 mg/dL — ABNORMAL LOW (ref 8.9–10.3)
Chloride: 103 mmol/L (ref 98–111)
Creatinine, Ser: 1.03 mg/dL (ref 0.61–1.24)
GFR, Estimated: >60 mL/min (ref >60)
Glucose, Bld: 205 mg/dL — ABNORMAL HIGH (ref 70–99)
Potassium: 4.1 mmol/L (ref 3.5–5.1)
Sodium: 135 mmol/L (ref 135–145)

## 2021-04-19 LAB — HEMOGLOBIN A1C
Hgb A1c MFr Bld: 7.1 % — ABNORMAL HIGH (ref 4.8–5.6)
Mean Plasma Glucose: 157.07 mg/dL

## 2021-04-19 LAB — RAPID URINE DRUG SCREEN, HOSP PERFORMED
Amphetamines: NOT DETECTED
Barbiturates: NOT DETECTED
Benzodiazepines: NOT DETECTED
Cocaine: NOT DETECTED
Opiates: NOT DETECTED
Tetrahydrocannabinol: NOT DETECTED

## 2021-04-19 LAB — GLUCOSE, CAPILLARY: Glucose-Capillary: 181 mg/dL — ABNORMAL HIGH (ref 70–99)

## 2021-04-19 MED ORDER — ASPIRIN 81 MG PO TBEC
81.0000 mg | DELAYED_RELEASE_TABLET | Freq: Every day | ORAL | 2 refills | Status: AC
Start: 2021-05-20 — End: ?

## 2021-04-19 MED ORDER — NICOTINE 21 MG/24HR TD PT24
21.0000 mg | MEDICATED_PATCH | Freq: Every day | TRANSDERMAL | Status: DC
  Filled 2021-04-19 (×2): qty 1

## 2021-04-19 MED ORDER — ATORVASTATIN CALCIUM 40 MG PO TABS: 40.0000 mg | ORAL_TABLET | Freq: Every day | ORAL | Status: DC

## 2021-04-19 MED ORDER — ATORVASTATIN CALCIUM 40 MG PO TABS: 40.0000 mg | ORAL_TABLET | Freq: Every day | ORAL | 2 refills | Status: DC

## 2021-04-19 MED ORDER — INSULIN ASPART 100 UNIT/ML IJ SOLN: 0.0000 [IU] | Freq: Three times a day (TID) | INTRAMUSCULAR | Status: DC

## 2021-04-19 MED ORDER — LORAZEPAM 2 MG/ML IJ SOLN: 2.0000 mg | Freq: Once | INTRAMUSCULAR | Status: DC

## 2021-04-19 MED ORDER — TICAGRELOR 90 MG PO TABS
90.0000 mg | ORAL_TABLET | Freq: Two times a day (BID) | ORAL | 2 refills | Status: AC
Start: 2021-05-20 — End: ?

## 2021-04-19 NOTE — Code Documentation (Signed)
Stroke Response Nurse Documentation Code Documentation  George Joyce is a 56 y.o. male arriving to [facility] ED via [facility] on 04/18/2021 with past medical hx of HTN, DM, Asthma, Anxiety, seizure. On No antithrombotic. Code stroke was activated by EMS.   Patient from home where he was LKW this am, unclear last known well, and now complaining of Left side weakness and slurred speech . Patient states that he noticed that his left side started to feel "weird" about 10am.  Then at 1430 this afternoon when he stood up he couldn't feel his left leg causing him to fall.  Stroke team at the bedside on patient arrival. Labs drawn and patient cleared for CT by Dr. Alvino Chapel. Patient to CT with team. NIHSS 17, see documentation for details and code stroke times. Patient with disoriented, right gaze preference , left hemianopia, left facial droop, left arm weakness, left leg weakness, left decreased sensation, dysarthria , and left neglect on exam. The following imaging was completed:  CT, CTA head and neck, CTP. Patient is not a candidate for IV Thrombolytic due to unclear LKW.  1626 IR activation post CTP completed. Transported to IR Family arrival shortly after IR activation Dr Estanislado Pandy spoke with 3 daughters prior to procedure. Consent obtained from daughter.   Bedside handoff with Anesthesia and IR staff.  Microbiologist received report.   Raliegh Ip  Stroke Response RN

## 2021-04-19 NOTE — Progress Notes (Signed)
Pt demanding to leave AMA. MD Erlinda Hong and NP Narda Rutherford have been made aware.

## 2021-04-19 NOTE — Progress Notes (Addendum)
STROKE TEAM PROGRESS NOTE   SUBJECTIVE (INTERVAL HISTORY) His daughter is at the bedside.  Overall his condition is rapidly improving. 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 Still has left sensory neglect, but moving all extremities. Passed swallow, on ASA and brilinta. MRI pending.    OBJECTIVE Temp:  [94.1 F (34.5 C)-99.1 F (37.3 C)] 99.1 F (37.3 C) (02/06 0800) Pulse Rate:  [71-91] 77 (02/06 1330) Resp:  [11-28] 18 (02/06 1330) BP: (98-176)/(47-110) 116/76 (02/06 1330) SpO2:  [88 %-100 %] 99 % (02/06 1330) Arterial Line BP: (102-151)/(43-71) 133/64 (02/06 0900)  Recent Labs  Lab 04/18/21 1520 04/18/21 2030 04/19/21 1205  GLUCAP 207* 142* 181*   Recent Labs  Lab 04/18/21 1520 04/18/21 1527 04/18/21 1948 04/19/21 0434  NA 136 136  --  135  K 4.0 3.9  --  4.1  CL 103 102  --  103  CO2 24  --   --  21*  GLUCOSE 205* 204*  --  205*  BUN 14 17  --  8  CREATININE 1.11 1.00  --  1.03  CALCIUM 9.3  --   --  8.7*  MG  --   --  1.8  --   PHOS  --   --  2.5  --    Recent Labs  Lab 04/18/21 1520  AST 23  ALT 32  ALKPHOS 48  BILITOT 0.7  PROT 7.5  ALBUMIN 3.9   Recent Labs  Lab 04/18/21 1520 04/18/21 1527 04/19/21 0434  WBC 7.5  --  7.3  NEUTROABS 4.1  --  5.9  HGB 14.8 15.6 13.3  HCT 43.9 46.0 38.1*  MCV 96.7  --  94.5  PLT 203  --  236   No results for input(s): CKTOTAL, CKMB, CKMBINDEX, TROPONINI in the last 168 hours. Recent Labs    04/18/21 1520  LABPROT 13.4  INR 1.0   Recent Labs    04/18/21 1728  COLORURINE YELLOW  LABSPEC <1.005*  PHURINE 8.0  GLUCOSEU 250*  HGBUR NEGATIVE  BILIRUBINUR NEGATIVE  KETONESUR NEGATIVE  PROTEINUR NEGATIVE  NITRITE NEGATIVE  LEUKOCYTESUR NEGATIVE       Component Value Date/Time   CHOL 238 (H) 04/19/2021 0434   TRIG 223 (H) 04/19/2021 0434   HDL 38 (L) 04/19/2021 0434   CHOLHDL 6.3 04/19/2021 0434   VLDL 45 (H) 04/19/2021 0434   LDLCALC 155 (H) 04/19/2021 0434   Lab Results  Component Value Date   HGBA1C  7.1 (H) 04/19/2021      Component Value Date/Time   LABOPIA NONE DETECTED 04/18/2021 1728   COCAINSCRNUR NONE DETECTED 04/18/2021 1728   LABBENZ NONE DETECTED 04/18/2021 1728   AMPHETMU NONE DETECTED 04/18/2021 1728   THCU NONE DETECTED 04/18/2021 1728   LABBARB NONE DETECTED 04/18/2021 1728    Recent Labs  Lab 04/18/21 1948  ETH <10    I have personally reviewed the radiological images below and agree with the radiology interpretations.  CT HEAD WO CONTRAST ( )  Result Date: 04/19/2021 CLINICAL DATA:  Subarachnoid hemorrhage EXAM: CT HEAD WITHOUT CONTRAST TECHNIQUE: Contiguous axial images were obtained from the base of the skull through the vertex without intravenous contrast. RADIATION DOSE REDUCTION: This exam was performed according to the departmental dose-optimization program which includes automated exposure control, adjustment of the mA and/or kV according to patient size and/or use of iterative reconstruction technique. COMPARISON:  04/18/2021 FINDINGS: Brain: Minimal residual subarachnoid hemorrhage is seen within the right parietal cortex, nearly resolved since prior examination. Additionally,  gyral thickening has improved, though sulcal effacement persists. These findings are, again, most in keeping with changes of hyper perfusion/reperfusion injury, less likely the sequela of acute infarction. No interval hemorrhage. No midline shift. No abnormal intra or extra-axial mass lesion. Ventricular size is normal. Cerebellum is unremarkable. Vascular: No hyperdense vessel or unexpected calcification. Skull: Normal. Negative for fracture or focal lesion. Sinuses/Orbits: No acute finding. Other: None. IMPRESSION: Near resolution of previously noted subarachnoid hemorrhage and improved gyral thickening, again most in keeping with changes of hyper perfusion/reperfusion injury. No interval hemorrhage. No midline shift. Electronically Signed   By: Helyn NumbersAshesh  Parikh M.D.   On: 04/19/2021 03:03    CT HEAD WO CONTRAST (5MM)  Result Date: 04/18/2021 CLINICAL DATA:  Stroke, follow up EXAM: CT HEAD WITHOUT CONTRAST TECHNIQUE: Contiguous axial images were obtained from the base of the skull through the vertex without intravenous contrast. RADIATION DOSE REDUCTION: This exam was performed according to the departmental dose-optimization program which includes automated exposure control, adjustment of the mA and/or kV according to patient size and/or use of iterative reconstruction technique. COMPARISON:  04/18/2021 FINDINGS: Brain: There is subtle subarachnoid hemorrhage within the right parietal cortex with associated gyral thickening and effacement of the cortical sulci in this region suspicious for changes of reperfusion injury given recent intervention, less likely acute infarction. No midline shift. Ventricular size is normal. Cerebellum is unremarkable. Vascular: No asymmetric hyperdense vasculature at the skull base. Skull: Normal. Negative for fracture or focal lesion. Sinuses/Orbits: Mild mucosal thickening within several ethmoid air cells bilaterally. Remaining paranasal sinuses are clear. Orbits are unremarkable. Other: Mastoid air cells and middle ear cavities are clear. IMPRESSION: Interval development of gyral thickening and subtle subarachnoid hemorrhage involving the right parietal cortex suspicious for changes of hyper perfusion/reperfusion injury, less likely acute infarction. This could be confirmed with MRI examination. Electronically Signed   By: Helyn NumbersAshesh  Parikh M.D.   On: 04/18/2021 22:48   CT CEREBRAL PERFUSION W CONTRAST  Result Date: 04/18/2021 CLINICAL DATA:  Stroke. EXAM: CT PERFUSION BRAIN TECHNIQUE: Multiphase CT imaging of the brain was performed following IV bolus contrast injection. Subsequent parametric perfusion maps were calculated using RAPID software. RADIATION DOSE REDUCTION: This exam was performed according to the departmental dose-optimization program which includes  automated exposure control, adjustment of the mA and/or kV according to patient size and/or use of iterative reconstruction technique. CONTRAST:  40mL OMNIPAQUE IOHEXOL 350 MG/ML SOLN COMPARISON:  Noncontrast head CT and CT angiogram head/neck performed earlier today. FINDINGS: CT Brain Perfusion Findings: CBF (<30%) Volume: 17mL Perfusion (Tmax>6.0s) volume: 90mL Mismatch Volume: 73mL Infarct Core: 17 mL Infarction Location:Right MCA vascular territory. These results were communicated to Dr. Thomasena Edisollins At 4:14 pmon 2/5/2023by text page via the [facility] messaging system. IMPRESSION: The perfusion software identifies hypoperfused parenchyma within the right MCA vascular territory totaling 90 mL (utilizing the Tmax>6 seconds threshold). 17 mL core infarct within the right MCA vascular territory. Reported mismatch: 73 mL. Electronically Signed   By: Jackey LogeKyle  Golden D.O.   On: 04/18/2021 16:17   CT HEAD CODE STROKE WO CONTRAST  Result Date: 04/18/2021 CLINICAL DATA:  Code stroke. Provided history: Neuro deficit, acute, stroke suspected. Additional history provided: Left-sided weakness, slurred speech, neglect. EXAM: CT HEAD WITHOUT CONTRAST TECHNIQUE: Contiguous axial images were obtained from the base of the skull through the vertex without intravenous contrast. RADIATION DOSE REDUCTION: This exam was performed according to the departmental dose-optimization program which includes automated exposure control, adjustment of the mA and/or kV according to patient  size and/or use of iterative reconstruction technique. COMPARISON:  No pertinent prior exams available for comparison. FINDINGS: Brain: Cerebral volume is normal. Partially empty sella turcica. There is no acute intracranial hemorrhage. No demarcated cortical infarct. No extra-axial fluid collection. No evidence of an intracranial mass. No midline shift. Vascular: A dense M1/M2 right MCA vessel is questioned (series 2, image 12). Atherosclerotic calcifications. Skull:  Normal. Negative for fracture or focal lesion. Sinuses/Orbits: Visualized orbits show no acute finding. Moderate mucosal thickening within the bilateral ethmoid sinuses. Large left maxillary sinus mucous retention cyst. ASPECTS ([place] Stroke Program Early CT Score) - Ganglionic level infarction (caudate, lentiform nuclei, internal capsule, insula, M1-M3 cortex): 7 - Supraganglionic infarction (M4-M6 cortex): 3 Total score (0-10 with 10 being normal): 10 These results were communicated to Dr. Thomasena Edis At 3:35 pmon 2/5/2023by text page via the [facility] messaging system. IMPRESSION: No acute intracranial hemorrhage or acute demarcated cortical infarction. A dense M1/M2 right MCA is questioned, suspicious for endoluminal thrombus. Correlate with pending CT angiogram head/neck. Paranasal sinus disease, as described. Electronically Signed   By: Jackey Loge D.O.   On: 04/18/2021 15:35   CT ANGIO HEAD NECK W WO CM W PERF (CODE STROKE)  Result Date: 04/18/2021 CLINICAL DATA:  Provided history: Neuro deficit, acute, stroke suspected; left-sided weakness, slurred speech and intermittent neglect. EXAM: CT ANGIOGRAPHY HEAD AND NECK TECHNIQUE: Multidetector CT imaging of the head and neck was performed using the standard protocol during bolus administration of intravenous contrast. Multiplanar CT image reconstructions and MIPs were obtained to evaluate the vascular anatomy. Carotid stenosis measurements (when applicable) are obtained utilizing NASCET criteria, using the distal internal carotid diameter as the denominator. RADIATION DOSE REDUCTION: This exam was performed according to the departmental dose-optimization program which includes automated exposure control, adjustment of the mA and/or kV according to patient size and/or use of iterative reconstruction technique. CONTRAST:  Administered contrast not known at this time. COMPARISON:  Noncontrast head CT performed immediately prior. FINDINGS: CTA NECK FINDINGS Aortic  arch: Common origin of the innominate and left common carotid arteries. Atherosclerotic plaque within the proximal major branch vessels of the neck. No hemodynamically significant innominate or proximal subclavian artery stenosis. Right carotid system: The common carotid artery is patent. Atherosclerotic plaque within the distal CCA, about the carotid bifurcation and within the proximal ICA. 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 Left carotid system: CCA and ICA patent within the neck without significant stenosis (50% or greater). Atherosclerotic plaque within the left carotid system within the neck, most notably about the carotid bifurcation and within the proximal ICA. Vertebral arteries: Vertebral arteries patent within the neck without significant stenosis. Skeleton: Reversal of the expected cervical lordosis. Cervical spondylosis. No acute bony abnormality or aggressive osseous lesion. Other neck: No neck mass or cervical lymphadenopathy. Upper chest: No consolidation within the imaged lung apices. Review of the MIP images confirms the above findings CTA HEAD FINDINGS Anterior circulation: The right ICA remains occluded at the precavernous and cavernous levels. There is reconstitution of enhancement within the paraclinoid/supraclinoid right ICA. The intracranial left ICA is patent. Atherosclerotic plaque within this vessel with no more than mild stenosis. The M1 middle cerebral arteries are patent. Severe stenosis at the origin of an M2 right MCA vessel (series 7, image image 113). This may reflect the presence of endoluminal thrombus at this site. No right M2 proximal branch occlusion is identified. No left M2 proximal branch occlusion or high-grade  proximal stenosis is identified. The anterior cerebral arteries are patent. 2 mm inferior projecting vascular protrusion arising from the paraclinoid left ICA, which may reflect an  aneurysm or infundibulum (series 7, image 121) (series 11, image 107). Posterior circulation: The intracranial vertebral arteries are patent. The basilar artery is patent. The posterior cerebral arteries are patent. Severe stenosis within the P2 right PCA (series 8, image 23) (moderate stenosis within the P2 left PCA. Posterior communicating arteries are diminutive or absent bilaterally. Venous sinuses: Within the limitations of contrast timing, no convincing thrombus. Anatomic variants: As described. Review of the MIP images confirms the above findings Attempts are being made to reach the ordering provider at this time. IMPRESSION: CTA neck: 1. 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 There is a somewhat tapered appearance leading up to the occlusion, and this may be secondary to dissection. 2. The left common carotid, left cervical internal carotid and bilateral vertebral arteries are patent within the neck without hemodynamically significant stenosis. Atherosclerotic plaque within the left carotid system, as described. CTA head: 1. 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 There is reconstitution of enhancement within the paraclinoid and supraclinoid right ICA. 2. Severe stenosis at the origin of an M2 right MCA vessel. There is an irregular appearance of the vessel at this site, and findings may reflect the presence of endoluminal thrombus. 3. Severe stenosis within the P2 right PCA. 4. Moderate stenosis within the P2 left PCA. 5. 2 mm inferiorly projecting vascular protrusion arising from the paraclinoid left ICA, which may reflect an aneurysm or infundibulum. Electronically Signed   By: Jackey Loge D.O.   On: 04/18/2021 15:53     PHYSICAL EXAM  Temp:  [94.1 F (34.5 C)-99.1 F (37.3 C)] 99.1 F (37.3 C) (02/06 0800) Pulse Rate:  [71-91] 77 (02/06 1330) Resp:  [11-28] 18 (02/06 1330) BP: (98-176)/(47-110) 116/76  (02/06 1330) SpO2:  [88 %-100 %] 99 % (02/06 1330) Arterial Line BP: (102-151)/(43-71) 133/64 (02/06 0900)  General - Well nourished, well developed, in no apparent distress.  Ophthalmologic - fundi not visualized due to noncooperation.  Cardiovascular - Regular rhythm and rate.  Mental Status -  Level of arousal and orientation to time, place, and person were intact. Irritated and asking to go home Language including expression, naming, repetition, comprehension was assessed and found intact.  Cranial Nerves II - XII - II - left visual field hemianopia. III, IV, VI - Extraocular movements intact. V - Facial sensation intact bilaterally. VII - Facial movement intact bilaterally. VIII - Hearing & vestibular intact bilaterally. X - Palate elevates symmetrically. XI - Chin turning & shoulder shrug intact bilaterally. XII - Tongue protrusion intact.  Motor Strength - The patients strength was normal in all extremities and pronator drift was absent.  Bulk was normal and fasciculations were absent.   Motor Tone - Muscle tone was assessed at the neck and appendages and was normal.  Reflexes - The patients reflexes were symmetrical in all extremities and he had no pathological reflexes.  Sensory - Light touch, temperature/pinprick were assessed and he has left sensory neglect.    Coordination - The patient had grossly normal movements in the hands with no ataxia.  Tremor was absent.  Gait and Station - deferred.   ASSESSMENT/PLAN Mr. George Joyce is a 56 y.o. male with history of hypertension, hyperlipidemia, diabetes, seizure admitted for left hand numbness, left leg weakness and fell. No tPA given  due to OSW.    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  Patient counseled to be compliant with his antithrombotic medications Ongoing aggressive stroke risk factor management Therapy recommendations: Outpatient PT/OT Disposition: Pending  Carotid stenosis/occlusion CTA head and neck right ICA occlusion beyond the bulb IR status post right ICA stenting On aspirin the Brilinta  Diabetes HgbA1c 7.1 goal < 7.0 Uncontrolled CBG monitoring SSI Diabetic coordinator on board DM education and close PCP follow up for better DM control  Hypertension Unstable BP goal less than 140 for 24 hours post IR On Cleviprex We will resume p.o. BP meds Long term BP goal normotensive  Hyperlipidemia Home meds: None LDL 155, goal < 70 Now on Lipitor 40 Continue statin at discharge  Tobacco abuse Current smoker Smoking cessation counseling provided Nicotine patch provided Pt is willing to quit  Other Stroke Risk Factors   Other Active Problems History of seizure, Dilantin was listed as home medication, however Dilantin level < 2.5, likely not taking at home  Hospital day # 1  This patient is critically ill due to right MCA stroke status post right ICA stenting and right MCA thrombectomy, unstable BP and at significant risk of neurological worsening, death form recurrent stroke, hemorrhagic transformation, encephalopathy, seizure. This patient's care requires constant monitoring of vital signs, hemodynamics, respiratory and cardiac monitoring, review of multiple databases, neurological assessment, discussion with family, other specialists and medical decision making of high complexity. I spent 40 minutes of neurocritical care time in the care of this patient. I had long  discussion with daughter [place] at bedside, updated pt current condition, treatment plan and potential prognosis, and answered all the questions.  She expressed understanding and appreciation.    Marvel PlanJindong Aanchal Cope, MD PhD Stroke Neurology 04/19/2021 3:13 PM    To contact Stroke Continuity provider, please refer to [email or web address]. After hours, contact General Neurology

## 2021-04-19 NOTE — Plan of Care (Signed)
Patient lying in bed, another daughter at bedside.  Patient stated that he would like to sign AMA form to leave and sleep in his own bed.  I told him that he still need to be watched for right green bleeding post interventional procedure, still need IV BP meds for BP management, still need MRI and 2D echo for stroke work-up.  Initially he agreed to stay but as soon as I left, RN told me that he signed AMA and left.  I called his home phone number and nobody picked up the phone and voicemail was full and not able to leave voicemail messages.  Called her daughter Nadara Mode and not able to reach her.  Called her daughter [place] and was able to talk to her and send prescription medication including aspirin, Brilinta and Lipitor to [facility] for them to pick up.  Marvel Plan, MD PhD Stroke Neurology 04/19/2021 3:46 PM

## 2021-04-19 NOTE — Evaluation (Signed)
Physical Therapy Evaluation Patient Details Name: George Joyce MRN: [id number] DOB: 1966-01-13 Today's Date: 04/19/2021  History of Present Illness  George Joyce is a 56 y/o male who presented to the ED with abnomal sensations in his L arm and L leg, L facial droop, and R gaze. 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 Patient underwent a revasculariqation of distal right MCA and a stinting of the right ICA.  PMHx: anxiety, depression, HTN, asthma, diabetes mellitus, dyslipidemia, GERD, Panic attacks, Seizures.  Clinical Impression  Pt was able to stand with supervision to urinate and wash his hands. With washing his hands he had trouble sequencing tasks and thought the sink/paper towel dispenser was broken when they were not. Pt walk 120 ft. with min assist, he needed verbal and tactile cuing to not drift right and to turn right. As he ambulated in the hallway his gait deficits became more pronounced Laying in bed, he had difficulty laying flat on his back likely due to neglect. When pointing out his attention deficits he would get frustrated and make up an excuses of deficits. Patient repeated that he want to go home and was frustrated that he was being held in the hospital. During physical exam patient showed deficits in attention, activity tolerance, safety, and deficit awareness. Recommending therapy services with home health therapy services to address the previously stated deficits. Will continue to follow acutely to maximize functional mobility, independence and safety.      Recommendations for follow up therapy are one component of a multi-disciplinary discharge planning process, led by the attending physician.  Recommendations may be updated based on patient status, additional functional criteria and insurance authorization.  Follow Up Recommendations Home health PT    Assistance Recommended at Discharge Frequent or constant Supervision/Assistance  Patient can return home  with the following  A lot of help with walking and/or transfers;A lot of help with bathing/dressing/bathroom;Assistance with cooking/housework;Direct supervision/assist for financial management;Direct supervision/assist for medications management;Assist for transportation;Help with stairs or ramp for entrance    Equipment Recommendations Rolling walker (2 wheels)  Recommendations for Other Services       Functional Status Assessment Patient has had a recent decline in their functional status and demonstrates the ability to make significant improvements in function in a reasonable and predictable amount of time.     Precautions / Restrictions Precautions Precautions: None Precaution Comments: Monitor BP Restrictions Weight Bearing Restrictions: No      Mobility  Bed Mobility Overal bed mobility: Needs Assistance Bed Mobility: Supine to Sit, Sit to Supine     Supine to sit: Supervision Sit to supine: Supervision   General bed mobility comments: Supervision for safty with L inattention, needs cues to be repeated, and line management    Transfers Overall transfer level: Needs assistance Equipment used: None Transfers: Sit to/from Stand Sit to Stand: Supervision           General transfer comment: Supervision for safty with L inattention, needs cues to be repeated, and line management    Ambulation/Gait Ambulation/Gait assistance: Min assist Gait Distance (Feet): 120 Feet Assistive device: None Gait Pattern/deviations: Step-through pattern, Decreased stride length, Decreased stance time - left, Drifts right/left Gait velocity: Decreased     General Gait Details: Patient needed a lot of cuing to make all 4 left turns during walk. Unsteady Gait.  Stairs            Wheelchair Mobility    Modified Rankin (Stroke Patients Only) Modified Rankin (Stroke  Patients Only) Pre-Morbid Rankin Score: No symptoms Modified Rankin: Moderately severe disability      Balance Overall balance assessment: Needs assistance Sitting-balance support: No upper extremity supported, Feet supported Sitting balance-Leahy Scale: Fair     Standing balance support: No upper extremity supported, During functional activity   Standing balance comment: Patient needed min assistance whe bending over to put the toilet seat down but was able to stand with out support to urinate and wash his hands.                             Pertinent Vitals/Pain Pain Assessment Pain Assessment: Faces Faces Pain Scale: No hurt    Home Living Family/patient expects to be discharged to:: Private residence Living Arrangements: Alone Available Help at Discharge: Family;Available PRN/intermittently Type of Home: House Home Access: Stairs to enter   [employer] of Steps: 7   Home Layout: One level Home Equipment: None      Prior Function Prior Level of Function : Independent/Modified Independent                     Hand Dominance        Extremity/Trunk Assessment   Upper Extremity Assessment Upper Extremity Assessment: Defer to OT evaluation    Lower Extremity Assessment Lower Extremity Assessment: RLE deficits/detail;LLE deficits/detail RLE Deficits / Details: Patient was able to move extremty against gravity with cues given multiple times LLE Deficits / Details: Patient was able to move extremty against gravity with cues given multiple times       Communication   Communication: No difficulties  Cognition Arousal/Alertness: Awake/alert Behavior During Therapy: Agitated Overall Cognitive Status: Impaired/Different from baseline Area of Impairment: Attention, Safety/judgement                         Safety/Judgement: Decreased awareness of safety, Decreased awareness of deficits     General Comments: Pt was frustrated with sittuation of being in the hospital and does not think that he has any deficits.        General  Comments      Exercises     Assessment/Plan    PT Assessment Patient needs continued PT services  PT Problem List Decreased coordination;Decreased cognition;Decreased knowledge of precautions;Decreased safety awareness;Impaired sensation;Decreased balance;Decreased activity tolerance;Decreased knowledge of use of DME;Decreased mobility       PT Treatment Interventions DME instruction;Gait training;Stair training;Functional mobility training;Therapeutic activities;Patient/family education;Cognitive remediation;Neuromuscular re-education;Balance training;Therapeutic exercise    PT Goals (Current goals can be found in the Care Plan section)  Acute Rehab PT Goals Patient Stated Goal: To return home and maintation independance PT Goal Formulation: With patient/family Time For Goal Achievement: 05/03/21 Potential to Achieve Goals: Fair    Frequency Min 4X/week     Co-evaluation               AM-PAC PT "6 Clicks" Mobility  Outcome Measure Help needed turning from your back to your side while in a flat bed without using bedrails?: A Little Help needed moving from lying on your back to sitting on the side of a flat bed without using bedrails?: A Little Help needed moving to and from a bed to a chair (including a wheelchair)?: A Little Help needed standing up from a chair using your arms (e.g., wheelchair or bedside chair)?: A Little Help needed to walk in hospital room?: A Lot Help needed climbing 3-5 steps with  a railing? : Total 6 Click Score: 15    End of Session Equipment Utilized During Treatment: Gait belt Activity Tolerance: Treatment limited secondary to agitation Patient left: in bed;with call bell/phone within reach;with bed alarm set Nurse Communication: Mobility status PT Visit Diagnosis: Other symptoms and signs involving the nervous system (R29.898);Unsteadiness on feet (R26.81);Other abnormalities of gait and mobility (R26.89)    Time: 1007-1030 PT Time  Calculation (min) (ACUTE ONLY): 23 min   Charges:   PT Evaluation $PT Eval Moderate Complexity: 1 Mod PT Treatments $Gait Training: 8-22 mins        Armanda Heritage, SPT   Armanda Heritage 04/19/2021, 1:28 PM

## 2021-04-19 NOTE — Progress Notes (Addendum)
Inpatient Diabetes Program Recommendations  AACE/ADA: New Consensus Statement on Inpatient Glycemic Control (2015)  Target Ranges:  Prepandial:   less than 140 mg/dL      Peak postprandial:   less than 180 mg/dL (1-2 hours)      Critically ill patients:  140 - 180 mg/dL   Lab Results  Component Value Date   GLUCAP 181 (H) 04/19/2021   HGBA1C 7.1 (H) 04/19/2021    Review of Glycemic Control  Latest Reference Range & Units 04/18/21 15:20 04/18/21 20:30 04/19/21 12:05  Glucose-Capillary 70 - 99 mg/dL 025 (H) 427 (H) 062 (H)   Diabetes history: DM 2 Outpatient Diabetes medications:  Metformin 1000 mg daily Current orders for Inpatient glycemic control:  None Inpatient Diabetes Program Recommendations:    Please consider adding Novolog correction tid with meals.   Thanks,  Beryl Meager, RN, BC-ADM Inpatient Diabetes Coordinator Pager [phone]  (8a-5p)

## 2021-04-19 NOTE — Progress Notes (Signed)
1335 patient demanding paperwork to sign out AMA. Dr. Roda Shutters came to bedside and explained the risks of leaving AMA. Patient's daughter at bedside and agrees to take him home. Two peripheral IV/s removed.

## 2021-04-19 NOTE — Progress Notes (Signed)
Patient does not want echo at this time.  He states he wants to sign paperwork to go home, so he can see his doctor.

## 2021-04-19 NOTE — Progress Notes (Signed)
OT Cancellation Note  Patient Details Name: George Joyce MRN: [id number] DOB: 1965/10/06   Cancelled Treatment:    Reason Eval/Treat Not Completed: Other (comment) (Per [facility], pt to be in bed for 24 hrs with leg straight s/p procedure. will follow up tomorrow.)  [place], OT/L   Acute OT Clinical Specialist [place] Pager [phone] Office [phone]  04/19/2021, 12:57 PM

## 2021-04-19 NOTE — Progress Notes (Signed)
Referring Physician(s): Dr. 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   Subjective:  Pt lying in bed. He denies HA or pain. Pt seems agitated and reluctant to participate in exam. Daughter states that pt speaks when no one is in room, but becomes quiet and stops talking when medical staff enter.   Allergies: Penicillins  Medications: Prior to Admission medications   Medication Sig Start Date End Date Taking? Authorizing Provider  albuterol (PROVENTIL HFA;VENTOLIN HFA) 108 (90 BASE) MCG/ACT inhaler Inhale 2 puffs into the lungs every 6 (six) hours as needed.    [provider]  CARVEDILOL PO Take 1 tablet by mouth 2 (two) times daily.    [provider]  ENALAPRIL MALEATE PO Take 1 tablet by mouth daily.    [provider]  Fluticasone-Salmeterol (ADVAIR DISKUS IN) Inhale 1 puff into the lungs every morning.    [provider]  HYDROCHLOROTHIAZIDE PO Take 1 tablet by mouth daily.    [provider]  meloxicam (MOBIC) 15 MG tablet Take 15 mg by mouth daily. 04/02/21   [provider]  METFORMIN HCL PO Take 1 tablet by mouth daily.    [provider]  metoCLOPramide (REGLAN) 10 MG tablet Take 1 tablet (10 mg total) by mouth every 6 (six) hours. 10/02/11 10/12/11  Chilton Si, PA-C  OVER THE COUNTER MEDICATION Take 2 tablets by mouth daily as needed. For Acid reflux    [provider]  oxyCODONE-acetaminophen (PERCOCET/ROXICET) 5-325 MG tablet Take 1 tablet by mouth 3 (three) times daily as needed for pain. 04/02/21   [provider]  pantoprazole (PROTONIX) 20 MG tablet Take 1 tablet (20 mg total) by mouth daily. 10/02/11 10/01/12  Chilton Si, PA-C  phenytoin (DILANTIN) 100 MG ER capsule Take 300 mg by mouth 2 (two) times daily.    [provider]  PRESCRIPTION MEDICATION Take 1 tablet by mouth 2 (two) times daily as needed. For panic attacks    [provider]  PRESCRIPTION MEDICATION Take 1 tablet by mouth daily.    [provider]  XTAMPZA ER 13.5 MG C12A Take 13.5 mg by mouth 2 (two) times daily. 04/02/21   [provider]     Vital Signs: BP 115/61    Pulse 87    Temp 99.1 F (37.3 C) (Axillary)    Resp (!) 22    Wt 193 lb 9 oz (87.8 kg)    SpO2 97%   Physical Exam HENT:     Head: Normocephalic and atraumatic.  Cardiovascular:     Rate and Rhythm: Normal rate and regular rhythm.  Pulmonary:     Effort: Pulmonary effort is normal. No respiratory distress.  Musculoskeletal:     Right lower leg: No edema.     Left lower leg: No edema.  Skin:    General: Skin is warm and dry.     Comments: R CFA site is soft with no appreciable pseudoaneurysm.  Bandage has small amount dried blood. No active bleeding or oozing to site noted. Distal pulse palpable on operative lower extremity.     Neurological:     Mental Status: He is alert.     Comments: Pt is aware that he is in hospital, but does not know which one. He states year is 2022 and month is February.  Speech  and comprehension are intact.  No facial droop noted Can spontaneously move all 4 extremities.  Right hand strength 5/5, Left hand strength 4/5 Dorsiflexion 5/5 bilaterally. Plantar flexion 5/5 bilaterally.  Bilateral pronator drift noted. Fine motor and coordination deferred d/t decreased participation level.  Gait not assessed Romberg not assessed Heel to toe not assessed  Pt denies HA.      Imaging: CT HEAD WO CONTRAST (5MM)  Result Date: 04/19/2021 CLINICAL DATA:  Subarachnoid hemorrhage EXAM: CT HEAD WITHOUT CONTRAST TECHNIQUE: Contiguous axial images were obtained from the base of the skull through the vertex without intravenous contrast. RADIATION DOSE REDUCTION: This exam was performed according to the  departmental dose-optimization program which includes automated exposure control, adjustment of the mA and/or kV according to patient size and/or use of iterative reconstruction technique. COMPARISON:  04/18/2021 FINDINGS: Brain: Minimal residual subarachnoid hemorrhage is seen within the right parietal cortex, nearly resolved since prior examination. Additionally, gyral thickening has improved, though sulcal effacement persists. These findings are, again, most in keeping with changes of hyper perfusion/reperfusion injury, less likely the sequela of acute infarction. No interval hemorrhage. No midline shift. No abnormal intra or extra-axial mass lesion. Ventricular size is normal. Cerebellum is unremarkable. Vascular: No hyperdense vessel or unexpected calcification. Skull: Normal. Negative for fracture or focal lesion. Sinuses/Orbits: No acute finding. Other: None. IMPRESSION: Near resolution of previously noted subarachnoid hemorrhage and improved gyral thickening, again most in keeping with changes of hyper perfusion/reperfusion injury. No interval hemorrhage. No midline shift. Electronically Signed   By: Helyn NumbersAshesh  Parikh M.D.   On: 04/19/2021 03:03   CT HEAD WO CONTRAST (5MM)  Result Date: 04/18/2021 CLINICAL DATA:  Stroke, follow up EXAM: CT HEAD WITHOUT CONTRAST TECHNIQUE: Contiguous axial images were obtained from the base of the skull through the vertex without intravenous contrast. RADIATION DOSE REDUCTION: This exam was performed according to the departmental dose-optimization program which includes automated exposure control, adjustment of the mA and/or kV according to patient size and/or use of iterative reconstruction technique. COMPARISON:  04/18/2021 FINDINGS: Brain: There is subtle subarachnoid hemorrhage within the right parietal cortex with associated gyral thickening and effacement of the cortical sulci in this region suspicious for changes of reperfusion injury given recent intervention, less  likely acute infarction. No midline shift. Ventricular size is normal. Cerebellum is unremarkable. Vascular: No asymmetric hyperdense vasculature at the skull base. Skull: Normal. Negative for fracture or focal lesion. Sinuses/Orbits: Mild mucosal thickening within several ethmoid air cells bilaterally. Remaining paranasal sinuses are clear. Orbits are unremarkable. Other: Mastoid air cells and middle ear cavities are clear. IMPRESSION: Interval development of gyral thickening and subtle subarachnoid hemorrhage involving the right parietal cortex suspicious for changes of hyper perfusion/reperfusion injury, less likely acute infarction. This could be confirmed with MRI examination. Electronically Signed   By: Helyn NumbersAshesh  Parikh M.D.   On: 04/18/2021 22:48   CT CEREBRAL PERFUSION W CONTRAST  Result Date: 04/18/2021 CLINICAL DATA:  Stroke. EXAM: CT PERFUSION BRAIN TECHNIQUE: Multiphase CT imaging of the brain was performed following IV bolus contrast injection. Subsequent parametric perfusion maps were calculated using RAPID software. RADIATION DOSE REDUCTION: This exam was performed according to the departmental dose-optimization program which includes automated exposure control, adjustment of the mA and/or kV according to patient size and/or use of iterative reconstruction technique. CONTRAST:  40mL OMNIPAQUE IOHEXOL 350 MG/ML SOLN COMPARISON:  Noncontrast head CT and CT angiogram head/neck performed earlier today. FINDINGS: CT Brain Perfusion Findings: CBF (<30%) Volume: 17mL Perfusion (Tmax>6.0s) volume: 90mL Mismatch  Volume: 65mL Infarct Core: 17 mL Infarction Location:Right MCA vascular territory. These results were communicated to Dr. Thomasena Edis At 4:14 pmon 2/5/2023by text page via the [facility] messaging system. IMPRESSION: The perfusion software identifies hypoperfused parenchyma within the right MCA vascular territory totaling 90 mL (utilizing the Tmax>6 seconds threshold). 17 mL core infarct within the right MCA  vascular territory. Reported mismatch: 73 mL. Electronically Signed   By: Jackey Loge D.O.   On: 04/18/2021 16:17   CT HEAD CODE STROKE WO CONTRAST  Result Date: 04/18/2021 CLINICAL DATA:  Code stroke. Provided history: Neuro deficit, acute, stroke suspected. Additional history provided: Left-sided weakness, slurred speech, neglect. EXAM: CT HEAD WITHOUT CONTRAST TECHNIQUE: Contiguous axial images were obtained from the base of the skull through the vertex without intravenous contrast. RADIATION DOSE REDUCTION: This exam was performed according to the departmental dose-optimization program which includes automated exposure control, adjustment of the mA and/or kV according to patient size and/or use of iterative reconstruction technique. COMPARISON:  No pertinent prior exams available for comparison. FINDINGS: Brain: Cerebral volume is normal. Partially empty sella turcica. There is no acute intracranial hemorrhage. No demarcated cortical infarct. No extra-axial fluid collection. No evidence of an intracranial mass. No midline shift. Vascular: A dense M1/M2 right MCA vessel is questioned (series 2, image 12). Atherosclerotic calcifications. Skull: Normal. Negative for fracture or focal lesion. Sinuses/Orbits: Visualized orbits show no acute finding. Moderate mucosal thickening within the bilateral ethmoid sinuses. Large left maxillary sinus mucous retention cyst. ASPECTS ([place] Stroke Program Early CT Score) - Ganglionic level infarction (caudate, lentiform nuclei, internal capsule, insula, M1-M3 cortex): 7 - Supraganglionic infarction (M4-M6 cortex): 3 Total score (0-10 with 10 being normal): 10 These results were communicated to Dr. Thomasena Edis At 3:35 pmon 2/5/2023by text page via the [facility] messaging system. IMPRESSION: No acute intracranial hemorrhage or acute demarcated cortical infarction. A dense M1/M2 right MCA is questioned, suspicious for endoluminal thrombus. Correlate with pending CT angiogram  head/neck. Paranasal sinus disease, as described. Electronically Signed   By: Jackey Loge D.O.   On: 04/18/2021 15:35   CT ANGIO HEAD NECK W WO CM W PERF (CODE STROKE)  Result Date: 04/18/2021 CLINICAL DATA:  Provided history: Neuro deficit, acute, stroke suspected; left-sided weakness, slurred speech and intermittent neglect. EXAM: CT ANGIOGRAPHY HEAD AND NECK TECHNIQUE: Multidetector CT imaging of the head and neck was performed using the standard protocol during bolus administration of intravenous contrast. Multiplanar CT image reconstructions and MIPs were obtained to evaluate the vascular anatomy. Carotid stenosis measurements (when applicable) are obtained utilizing NASCET criteria, using the distal internal carotid diameter as the denominator. RADIATION DOSE REDUCTION: This exam was performed according to the departmental dose-optimization program which includes automated exposure control, adjustment of the mA and/or kV according to patient size and/or use of iterative reconstruction technique. CONTRAST:  Administered contrast not known at this time. COMPARISON:  Noncontrast head CT performed immediately prior. FINDINGS: CTA NECK FINDINGS Aortic arch: Common origin of the innominate and left common carotid arteries. Atherosclerotic plaque within the proximal major branch vessels of the neck. No hemodynamically significant innominate or proximal subclavian artery stenosis. Right carotid system: The common carotid artery is patent. Atherosclerotic plaque within the distal CCA, about the carotid bifurcation and within the proximal ICA. 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 Left carotid system: CCA and ICA patent within the neck without significant stenosis (50% or greater). Atherosclerotic plaque within the left carotid system within  the neck, most notably about the carotid bifurcation and within the proximal ICA. Vertebral  arteries: Vertebral arteries patent within the neck without significant stenosis. Skeleton: Reversal of the expected cervical lordosis. Cervical spondylosis. No acute bony abnormality or aggressive osseous lesion. Other neck: No neck mass or cervical lymphadenopathy. Upper chest: No consolidation within the imaged lung apices. Review of the MIP images confirms the above findings CTA HEAD FINDINGS Anterior circulation: The right ICA remains occluded at the precavernous and cavernous levels. There is reconstitution of enhancement within the paraclinoid/supraclinoid right ICA. The intracranial left ICA is patent. Atherosclerotic plaque within this vessel with no more than mild stenosis. The M1 middle cerebral arteries are patent. Severe stenosis at the origin of an M2 right MCA vessel (series 7, image image 113). This may reflect the presence of endoluminal thrombus at this site. No right M2 proximal branch occlusion is identified. No left M2 proximal branch occlusion or high-grade proximal stenosis is identified. The anterior cerebral arteries are patent. 2 mm inferior projecting vascular protrusion arising from the paraclinoid left ICA, which may reflect an aneurysm or infundibulum (series 7, image 121) (series 11, image 107). Posterior circulation: The intracranial vertebral arteries are patent. The basilar artery is patent. The posterior cerebral arteries are patent. Severe stenosis within the P2 right PCA (series 8, image 23) (moderate stenosis within the P2 left PCA. Posterior communicating arteries are diminutive or absent bilaterally. Venous sinuses: Within the limitations of contrast timing, no convincing thrombus. Anatomic variants: As described. Review of the MIP images confirms the above findings Attempts are being made to reach the ordering provider at this time. IMPRESSION: CTA neck: 1. 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 There is a somewhat tapered appearance  leading up to the occlusion, and this may be secondary to dissection. 2. The left common carotid, left cervical internal carotid and bilateral vertebral arteries are patent within the neck without hemodynamically significant stenosis. Atherosclerotic plaque within the left carotid system, as described. CTA head: 1. 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 There is reconstitution of enhancement within the paraclinoid and supraclinoid right ICA. 2. Severe stenosis at the origin of an M2 right MCA vessel. There is an irregular appearance of the vessel at this site, and findings may reflect the presence of endoluminal thrombus. 3. Severe stenosis within the P2 right PCA. 4. Moderate stenosis within the P2 left PCA. 5. 2 mm inferiorly projecting vascular protrusion arising from the paraclinoid left ICA, which may reflect an aneurysm or infundibulum. Electronically Signed   By: Jackey LogeKyle  Golden D.O.   On: 04/18/2021 15:53    Labs:  CBC: Recent Labs    04/18/21 1520 04/18/21 1527 04/19/21 0434  WBC 7.5  --  7.3  HGB 14.8 15.6 13.3  HCT 43.9 46.0 38.1*  PLT 203  --  236    COAGS: Recent Labs    04/18/21 1520  INR 1.0  APTT 30    BMP: Recent Labs    04/18/21 1520 04/18/21 1527 04/19/21 0434  NA 136 136 135  K 4.0 3.9 4.1  CL 103 102 103  CO2 24  --  21*  GLUCOSE 205* 204* 205*  BUN 14 17 8   CALCIUM 9.3  --  8.7*  CREATININE 1.11 1.00 1.03  GFRNONAA >60  --  >60    LIVER FUNCTION TESTS: Recent Labs    04/18/21 1520  BILITOT 0.7  AST 23  ALT 32  ALKPHOS 48  PROT 7.5  ALBUMIN 3.9    Assessment and Plan: Pt lying in bed. He denies HA or pain. Pt is agitated and reluctant to participate in exam. Daughter states that pt speaks when no one is in room, but becomes quiet and stops talking when medical staff enter.   Pt is aware that he is in hospital, but does not know which one. He states year is 2022 and month is February.   Speech and comprehension are intact.  No facial droop noted Can spontaneously move all 4 extremities.  Right hand strength 5/5, Left hand strength 4/5 Dorsiflexion 5/5 bilaterally. Plantar flexion 5/5 bilaterally.  Bilateral pronator drift noted. Fine motor and coordination deferred d/t decreased participation level.  Gait not assessed Romberg not assessed Heel to toe not assessed  Pt denies HA.  R CFA site is soft with no appreciable pseudoaneurysm.  Bandage has small amount dried blood. No active bleeding or oozing to site noted. Distal pulse palpable on operative lower extremity.     Plan to discharge to rehab.  Please call IR with questions or concerns.  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

## 2021-04-19 NOTE — Progress Notes (Signed)
Patient requesting to leave AMA. Requested MD Roda Shutters or MD Deveshwar/ NP Coralee North to please come speak to patient.

## 2021-04-19 NOTE — Progress Notes (Signed)
°  Transition of Care [facility]) Screening Note   Patient Details  Name: George Joyce Date of Birth: Apr 11, 1965   Transition of [facility]) CM/SW Contact:    Mearl Latin, LCSW Phone Number: 04/19/2021, 8:30 AM    Transition of Care Department [facility]) has reviewed patient and no TOC needs have been identified at this time. We will continue to monitor patient advancement through interdisciplinary progression rounds. If new patient transition needs arise, please place a TOC consult.

## 2021-04-20 LAB — HIV ANTIBODY (ROUTINE TESTING W REFLEX): HIV Screen 4th Generation wRfx: REACTIVE — AB

## 2021-04-20 NOTE — Anesthesia Postprocedure Evaluation (Signed)
Anesthesia Post Note  Patient: George Joyce  Procedure(s) Performed: RADIOLOGY WITH ANESTHESIA     Patient location during evaluation: PACU Anesthesia Type: General Level of consciousness: awake and alert Pain management: pain level controlled Vital Signs Assessment: post-procedure vital signs reviewed and stable Respiratory status: spontaneous breathing, nonlabored ventilation, respiratory function stable and patient connected to nasal cannula oxygen Cardiovascular status: blood pressure returned to baseline and stable Postop Assessment: no apparent nausea or vomiting Anesthetic complications: no   No notable events documented.  Last Vitals:  Vitals:   04/19/21 1315 04/19/21 1330  BP: 112/75 116/76  Pulse: 71 77  Resp: 17 18  Temp:    SpO2: 99% 99%    Last Pain:  Vitals:   04/19/21 0800  TempSrc: Axillary  PainSc: 0-No pain                 Kennieth Rad

## 2021-04-21 ENCOUNTER — Telehealth: Payer: Self-pay

## 2021-04-21 LAB — HIV-1/2 AB - DIFFERENTIATION
HIV 1 Ab: REACTIVE — AB
HIV 2 Ab: NONREACTIVE

## 2021-04-21 NOTE — Telephone Encounter (Signed)
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 Patient information faxed to [facility] to aid in linkage to care.   Sandie Ano, RN

## 2021-04-27 ENCOUNTER — Other Ambulatory Visit: Payer: Self-pay | Admitting: Family Medicine

## 2021-04-27 DIAGNOSIS — R519 Headache, unspecified: Secondary | ICD-10-CM

## 2021-04-27 DIAGNOSIS — I63131 Cerebral infarction due to embolism of right carotid artery: Secondary | ICD-10-CM

## 2021-04-28 NOTE — Discharge Summary (Signed)
Stroke Discharge Summary  Patient ID: George Joyce    l   MRN: [id number]      DOB: Jul 18, 1965  Date of Admission: 04/18/2021 Date of Discharge: 04/28/2021  Attending Physician:  No att. 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   albuterol 108 (90 Base) MCG/ACT inhaler Commonly known as: VENTOLIN HFA Inhale 2 puffs into the lungs every 6 (six) hours as needed.   aspirin 81 MG chewable tablet Chew 1 tablet (81 mg total) by mouth daily.   atorvastatin 40 MG tablet Commonly known as: LIPITOR Take 1 tablet (40 mg total) by mouth daily.   CARVEDILOL PO Take 1 tablet by mouth 2 (two) times daily.   ENALAPRIL MALEATE PO Take 1 tablet by mouth daily.   HYDROCHLOROTHIAZIDE PO Take 1 tablet by mouth daily.   METFORMIN HCL PO Take 1 tablet by mouth daily.   metoCLOPramide 10 MG tablet Commonly known as: REGLAN Take 1 tablet (10 mg total) by mouth every 6 (six) hours.   OVER THE COUNTER MEDICATION Take 2 tablets by mouth daily as needed. For Acid reflux   oxyCODONE-acetaminophen 5-325 MG tablet Commonly known as: PERCOCET/ROXICET Take 1 tablet by mouth 3 (three) times daily as needed for pain.   pantoprazole 20 MG tablet Commonly known as: PROTONIX Take 1 tablet (20 mg total) by mouth daily.   PRESCRIPTION MEDICATION Take 1  tablet by mouth 2 (two) times daily as needed. For panic attacks   PRESCRIPTION MEDICATION Take 1 tablet by mouth daily.   ticagrelor 90 MG Tabs tablet Commonly known as: BRILINTA Take 1 tablet (90 mg total) by mouth 2 (two) times daily.   Xtampza ER 13.5 MG C12a Generic drug: oxyCODONE ER Take 13.5 mg by mouth 2 (two) times daily.        LABORATORY STUDIES CBC    Component Value Date/Time   WBC 7.3 04/19/2021 0434   RBC 4.03 (L) 04/19/2021 0434   HGB 13.3 04/19/2021 0434   HCT 38.1 (L) 04/19/2021 0434   PLT 236 04/19/2021 0434   MCV 94.5 04/19/2021 0434   MCH 33.0 04/19/2021 0434   MCHC 34.9 04/19/2021 0434   RDW 12.6 04/19/2021 0434   LYMPHSABS 1.2 04/19/2021 0434   MONOABS 0.2 04/19/2021 0434   EOSABS 0.0 04/19/2021 0434   BASOSABS 0.0 04/19/2021 0434   CMP    Component Value Date/Time   NA 135 04/19/2021 0434   K 4.1 04/19/2021 0434   CL 103 04/19/2021 0434   CO2 21 (L) 04/19/2021 0434   GLUCOSE 205 (H) 04/19/2021 0434   BUN 8 04/19/2021 0434   CREATININE 1.03 04/19/2021 0434   CALCIUM 8.7 (L) 04/19/2021 0434  PROT 7.5 04/18/2021 1520   ALBUMIN 3.9 04/18/2021 1520   AST 23 04/18/2021 1520   ALT 32 04/18/2021 1520   ALKPHOS 48 04/18/2021 1520   BILITOT 0.7 04/18/2021 1520   GFRNONAA >60 04/19/2021 0434   GFRAA >90 10/02/2011 1237   COAGS Lab Results  Component Value Date   INR 1.0 04/18/2021   Lipid Panel    Component Value Date/Time   CHOL 238 (H) 04/19/2021 0434   TRIG 223 (H) 04/19/2021 0434   HDL 38 (L) 04/19/2021 0434   CHOLHDL 6.3 04/19/2021 0434   VLDL 45 (H) 04/19/2021 0434   LDLCALC 155 (H) 04/19/2021 0434   HgbA1C  Lab Results  Component Value Date   HGBA1C 7.1 (H) 04/19/2021   Urinalysis    Component Value Date/Time   COLORURINE YELLOW 04/18/2021 1728   APPEARANCEUR CLEAR 04/18/2021 1728   LABSPEC <1.005 (L) 04/18/2021 1728   PHURINE 8.0 04/18/2021 1728   GLUCOSEU 250 (A) 04/18/2021 1728   HGBUR NEGATIVE 04/18/2021 1728    BILIRUBINUR NEGATIVE 04/18/2021 1728   KETONESUR NEGATIVE 04/18/2021 1728   PROTEINUR NEGATIVE 04/18/2021 1728   NITRITE NEGATIVE 04/18/2021 1728   LEUKOCYTESUR NEGATIVE 04/18/2021 1728   Urine Drug Screen     Component Value Date/Time   LABOPIA NONE DETECTED 04/18/2021 1728   COCAINSCRNUR NONE DETECTED 04/18/2021 1728   LABBENZ NONE DETECTED 04/18/2021 1728   AMPHETMU NONE DETECTED 04/18/2021 1728   THCU NONE DETECTED 04/18/2021 1728   LABBARB NONE DETECTED 04/18/2021 1728    Alcohol Level    Component Value Date/Time   ETH <10 04/18/2021 1948     SIGNIFICANT DIAGNOSTIC STUDIES CT HEAD WO CONTRAST ( )  Result Date: 04/19/2021 CLINICAL DATA:  Subarachnoid hemorrhage EXAM: CT HEAD WITHOUT CONTRAST TECHNIQUE: Contiguous axial images were obtained from the base of the skull through the vertex without intravenous contrast. RADIATION DOSE REDUCTION: This exam was performed according to the departmental dose-optimization program which includes automated exposure control, adjustment of the mA and/or kV according to patient size and/or use of iterative reconstruction technique. COMPARISON:  04/18/2021 FINDINGS: Brain: Minimal residual subarachnoid hemorrhage is seen within the right parietal cortex, nearly resolved since prior examination. Additionally, gyral thickening has improved, though sulcal effacement persists. These findings are, again, most in keeping with changes of hyper perfusion/reperfusion injury, less likely the sequela of acute infarction. No interval hemorrhage. No midline shift. No abnormal intra or extra-axial mass lesion. Ventricular size is normal. Cerebellum is unremarkable. Vascular: No hyperdense vessel or unexpected calcification. Skull: Normal. Negative for fracture or focal lesion. Sinuses/Orbits: No acute finding. Other: None. IMPRESSION: Near resolution of previously noted subarachnoid hemorrhage and improved gyral thickening, again most in keeping with changes of  hyper perfusion/reperfusion injury. No interval hemorrhage. No midline shift. Electronically Signed   By: Helyn Numbers M.D.   On: 04/19/2021 03:03   CT HEAD WO CONTRAST ( )  Result Date: 04/18/2021 CLINICAL DATA:  Stroke, follow up EXAM: CT HEAD WITHOUT CONTRAST TECHNIQUE: Contiguous axial images were obtained from the base of the skull through the vertex without intravenous contrast. RADIATION DOSE REDUCTION: This exam was performed according to the departmental dose-optimization program which includes automated exposure control, adjustment of the mA and/or kV according to patient size and/or use of iterative reconstruction technique. COMPARISON:  04/18/2021 FINDINGS: Brain: There is subtle subarachnoid hemorrhage within the right parietal cortex with associated gyral thickening and effacement of the cortical sulci in this region suspicious for changes of reperfusion injury given recent intervention, less  likely acute infarction. No midline shift. Ventricular size is normal. Cerebellum is unremarkable. Vascular: No asymmetric hyperdense vasculature at the skull base. Skull: Normal. Negative for fracture or focal lesion. Sinuses/Orbits: Mild mucosal thickening within several ethmoid air cells bilaterally. Remaining paranasal sinuses are clear. Orbits are unremarkable. Other: Mastoid air cells and middle ear cavities are clear. IMPRESSION: Interval development of gyral thickening and subtle subarachnoid hemorrhage involving the right parietal cortex suspicious for changes of hyper perfusion/reperfusion injury, less likely acute infarction. This could be confirmed with MRI examination. Electronically Signed   By: Fidela Salisbury M.D.   On: 04/18/2021 22:48   IR CT Head Ltd  Result Date: 04/20/2021 INDICATION: Acute onset of right-sided gaze deviation, left-sided hemiplegia, dysarthria and left-sided neglect. Occluded right internal carotid artery proximally near flow-limiting thrombus in the distal right  middle cerebral artery extending into the superior and inferior divisions on CT angiogram of the head and neck. EXAM: 1. EMERGENT LARGE VESSEL OCCLUSION THROMBOLYSIS (POSTERIOR CIRCULATION) COMPARISON:  CT angiogram of the head and neck of April 18, 2021. MEDICATIONS: Vancomycin 1 g antibiotic was administered within 1 hour of the procedure. ANESTHESIA/SEDATION: General anesthesia. CONTRAST:  Omnipaque 300 approximately 150 mL. FLUOROSCOPY TIME:  Fluoroscopy Time: 47 minutes 30 seconds (2342 mGy). COMPLICATIONS: None immediate. TECHNIQUE: Following a full explanation of the procedure along with the potential associated complications, an informed witnessed consent was obtained. The risks of intracranial hemorrhage of 10%, worsening neurological deficit, ventilator dependency, death and inability to revascularize were all reviewed in detail with the patient's daughters The patient was then put under general anesthesia by the Department of Anesthesiology at [facility]. The right groin was prepped and draped in the usual sterile fashion. Thereafter using modified Seldinger technique, transfemoral access into the right common femoral artery was obtained without difficulty. Over a 0.035 inch guidewire an 8 [place] Pinnacle 25 cm sheath was inserted. Through this, and also over a 0.035 inch guidewire a 5 [place] JB 1 catheter was advanced to the aortic arch region and selectively positioned in the common carotid artery and the innominate artery. FINDINGS: Innominate arteriogram demonstrates the right subclavian artery to be widely patent. Visualized right vertebral artery origin and distally is widely patent. Flow is noted in the basilar artery, the right posteroinferior cerebellar artery to the level of the superior cerebral arteries bilaterally on the lateral projection. The right common carotid artery proximally demonstrates wide patency. The right common carotid arteriogram demonstrates right external carotid  artery and its major branches to be widely patent. Right internal carotid artery at the bulb and just distally demonstrates a complete angiographic occlusion without evidence of a delayed angiographic string sign. More distally, reconstitution of the right internal carotid artery supraclinoid segment is seen via the right ophthalmic artery from the right external carotid artery branches. Flow is noted into the supraclinoid right ICA and also in the right middle cerebral artery. PROCEDURE: Diagnostic JB 1 catheter in the right common carotid artery was then exchanged over a 0.035 inch 300 cm Rosen exchange guidewire for an 087 95 cm balloon guide catheter which had been prepped with 50% contrast and 50% heparinized saline infusion. The guidewire was removed. Good aspiration obtained from the hub of balloon guide catheter in the distal right common carotid artery just proximal to the origin of the right internal carotid artery. Over a 0.014 inch standard Synchro micro guidewire with a moderate J configuration, an 021 160 cm Phenom microcatheter was advanced without difficulty to the proximal cavernous  segment. The guidewire was removed. Good aspiration obtained from the hub of the Phenom microcatheter. A gentle control arteriogram performed through the microcatheter demonstrated antegrade flow into the cavernous, and supraclinoid segments. Right middle cerebral artery demonstrates near completely flow-limiting thrombus in the distal right M1 segment extending into the origin of the superior and inferior divisions. A 4 mm x 30 mm Viatrac 14 angioplasty balloon catheter which had been prepped with 50% contrast and 50% heparinized saline infusion antegradely, and with heparinized saline infusion retrogradely was then advanced over the exchange micro guidewire using the rapid exchange technique. Balloon was positioned such that the proximal and distal markers were just adequate distance from the site of the severe  stenosis in the proximal right internal carotid artery. Control inflation was then performed to the micro inflation syringe device via micro tubing to 8 atmospheres achieving a diameter of 4 mm where it was maintained for approximately 20 seconds. The balloon was then deflated and retrieved and removed maintaining good distal positioning of the exchange micro guidewire. A control arteriogram performed through the balloon guide catheter in the right internal carotid artery origin demonstrated significantly improved caliber and flow through the right internal carotid artery proximal and distally. Mild to moderate irregularity in the petrous and cavernous segment of the right internal carotid artery probably represents intracranial arteriosclerosis. There is now free flow in the right middle cerebral artery proximally. However, there continues to be change in the prominent near occlusive filling defect in the distal right middle cerebral artery extending into the proximal superior and inferior divisions. Over the exchange micro guidewire, a combination of the Phenom 21 microcatheter inside of a 55 132 cm Zoom aspiration catheter was advanced to the cavernous right ICA. The exchange micro guidewire was removed. Over a 0.014 inch standard Synchro micro guidewire with a moderate J configuration, and a torque device access was obtained into the right middle cerebral artery and into the dominant inferior division M2-3 region followed by the microcatheter. The guidewire was removed. Good aspiration was obtained from the hub of the microcatheter. A gentle control arteriogram performed through the microcatheter demonstrated safe positioning of the tip of the microcatheter which was connected to continuous heparinized saline infusion. At this time, a 4 mm x 40 mm Solitaire X retrieval device was advanced to the distal end of microcatheter and deployed in the usual manner. Proximal portion of the device was just proximal to the  prominent filling defect in the distal right middle cerebral artery. Zoom aspiration catheter was advanced into the distal right M1 segment. With constant aspiration being applied at the hub of the Zoom aspiration catheter with a Penumbra aspiration device, and with a 20 mL syringe at the hub of the balloon guide catheter in the right internal carotid artery for proximal flow arrest for approximately 28 minutes, the combination of the retrieval device and the Zoom aspiration catheter was retrieved. Following reversal of flow arrest in the proximal right internal carotid artery, a control arteriogram performed demonstrated complete revascularization of the right internal carotid proximally and distally. A TICI 2C revascularization was achieved of the right middle cerebral artery with flash filling of the right anterior cerebral artery. Two moderate-sized sticky clots were identified entangled in the retrieval device. A 4/7 NAV Emboshield retrieval device was then prepped and purged in its housing with heparinized saline infusion. The device end was cleared of air as was the proximal portion of the delivery system. The filter device was captured into the delivery microcatheter.  The combination was removed. A moderate J configuration was given to the distal end of the 014 inch micro guidewire. Thereafter, using biplane DSA mode with continuous heparinized saline infusion, combination of the filter delivery catheter with the micro guidewire combination was removed was advanced without difficulty through the proximal right internal carotid artery into the distal cervical right ICA. The filter was then deployed in the usual manner without any difficulty. Under constant fluoroscopic guidance, the filter delivery microcatheter was retrieved and removed. A control arteriogram performed through the balloon guide catheter in the right common carotid artery demonstrated full apposition of the filter device to the right ICA. The  form of the proximal right internal carotid artery angioplastied segment. It was decided to proceed with deploying a 6/8 mm x 40 mm Xact stent. This was prepped retrogradely in its housing. Using the rapid exchange technique, the stent delivery apparatus was then advanced using the rapid exchange technique, and positioned with the proximal and distal markers adequate distance from the site of the severe stenosis. Stent was then deployed in the usual manner without any difficulty. With the filter kept stable distally the stent delivery apparatus was retrieved and removed. A control arteriogram performed through the balloon guide catheter in the right common carotid artery demonstrated excellent apposition proximally and distally. Given the waist at the site of the angioplasty, a 5 mm x 30 mm Viatrac 14 angioplasty balloon catheter which had been prepped as described above was advanced in a rapid exchange technique, and positioned within the deployed stent adequate distant from the site of the waist. The balloon was then expanded to 8 atmospheres where it was maintained for approximately 20 seconds. This was then deflated and removed. Control arteriogram performed through the balloon guide catheter in the right common carotid artery demonstrated excellent apposition proximally and distally intracranially without evidence of intraluminal filling defects. A filter capture device was then advanced using the rapid exchange technique to the proximal marker on the filter device. This was then captured by retrieving on the micro guidewire and the combination was retrieved and removed under constant fluoroscopic guidance. Control arteriograms were then performed at 10 and 20 minutes. The 20 minute injection demonstrated early caliber irregularity at the stent surface which necessitated the use of a bolus dose of IV cangrelor followed by infusion for 4 hours. CT was obtained prior to cangrelor IV dosing, and also following the  end of the procedure. Patient was loaded with aspirin 81 mg a day and Brilinta 180 mg via an orogastric tube prior to the angioplasty initially. CT scans demonstrated no evidence of intracranial hemorrhage or mass effect. A contrast stain was seen in the right parietal subcortical area on the final CT of the brain. A final control arteriogram performed through the balloon guide catheter in the right common carotid artery continued to demonstrate excellent flow through the stented segment intracranially without evidence of intraluminal filling defects or of occlusions. The balloon guide was then removed. The 8 [place] Pinnacle sheath was removed with successful hemostasis using an 8 French Angio-Seal closure device. Distal pulses remained palpable at the end of the procedure bilaterally. Patient's general anesthesia was then reversed, and the patient was extubated without difficulty. Upon recovery, the patient was beginning to move his left arm and leg and responded appropriately. He was then transferred to the neuro ICU for post revascularization care. IMPRESSION: Status post endovascular complete revascularization of near complete occlusion of the right middle cerebral artery in the distal M1 segment,  extending into the proximal superior and inferior divisions with 1 pass with a 4 mm x 40 mm Solitaire X retrieval device and contact aspiration achieving a TICI 2C revascularization. Status post endovascular revascularization of occluded right internal carotid proximally with stent assisted angioplasty, and distal protection as described above. PLAN: Follow-up in the clinic 2 weeks post discharge. Electronically Signed   By: Luanne Bras M.D.   On: 04/20/2021 08:14   IR CT Head Ltd  Result Date: 04/20/2021 INDICATION: Acute onset of right-sided gaze deviation, left-sided hemiplegia, dysarthria and left-sided neglect. Occluded right internal carotid artery proximally near flow-limiting thrombus in the distal  right middle cerebral artery extending into the superior and inferior divisions on CT angiogram of the head and neck. EXAM: 1. EMERGENT LARGE VESSEL OCCLUSION THROMBOLYSIS (POSTERIOR CIRCULATION) COMPARISON:  CT angiogram of the head and neck of April 18, 2021. MEDICATIONS: Vancomycin 1 g antibiotic was administered within 1 hour of the procedure. ANESTHESIA/SEDATION: General anesthesia. CONTRAST:  Omnipaque 300 approximately 150 mL. FLUOROSCOPY TIME:  Fluoroscopy Time: 47 minutes 30 seconds (2342 mGy). COMPLICATIONS: None immediate. TECHNIQUE: Following a full explanation of the procedure along with the potential associated complications, an informed witnessed consent was obtained. The risks of intracranial hemorrhage of 10%, worsening neurological deficit, ventilator dependency, death and inability to revascularize were all reviewed in detail with the patient's daughters The patient was then put under general anesthesia by the Department of Anesthesiology at [facility]. The right groin was prepped and draped in the usual sterile fashion. Thereafter using modified Seldinger technique, transfemoral access into the right common femoral artery was obtained without difficulty. Over a 0.035 inch guidewire an 8 [place] Pinnacle 25 cm sheath was inserted. Through this, and also over a 0.035 inch guidewire a 5 [place] JB 1 catheter was advanced to the aortic arch region and selectively positioned in the common carotid artery and the innominate artery. FINDINGS: Innominate arteriogram demonstrates the right subclavian artery to be widely patent. Visualized right vertebral artery origin and distally is widely patent. Flow is noted in the basilar artery, the right posteroinferior cerebellar artery to the level of the superior cerebral arteries bilaterally on the lateral projection. The right common carotid artery proximally demonstrates wide patency. The right common carotid arteriogram demonstrates right external  carotid artery and its major branches to be widely patent. Right internal carotid artery at the bulb and just distally demonstrates a complete angiographic occlusion without evidence of a delayed angiographic string sign. More distally, reconstitution of the right internal carotid artery supraclinoid segment is seen via the right ophthalmic artery from the right external carotid artery branches. Flow is noted into the supraclinoid right ICA and also in the right middle cerebral artery. PROCEDURE: Diagnostic JB 1 catheter in the right common carotid artery was then exchanged over a 0.035 inch 300 cm Rosen exchange guidewire for an 087 95 cm balloon guide catheter which had been prepped with 50% contrast and 50% heparinized saline infusion. The guidewire was removed. Good aspiration obtained from the hub of balloon guide catheter in the distal right common carotid artery just proximal to the origin of the right internal carotid artery. Over a 0.014 inch standard Synchro micro guidewire with a moderate J configuration, an 021 160 cm Phenom microcatheter was advanced without difficulty to the proximal cavernous segment. The guidewire was removed. Good aspiration obtained from the hub of the Phenom microcatheter. A gentle control arteriogram performed through the microcatheter demonstrated antegrade flow into the cavernous, and supraclinoid segments.  Right middle cerebral artery demonstrates near completely flow-limiting thrombus in the distal right M1 segment extending into the origin of the superior and inferior divisions. A 4 mm x 30 mm Viatrac 14 angioplasty balloon catheter which had been prepped with 50% contrast and 50% heparinized saline infusion antegradely, and with heparinized saline infusion retrogradely was then advanced over the exchange micro guidewire using the rapid exchange technique. Balloon was positioned such that the proximal and distal markers were just adequate distance from the site of the severe  stenosis in the proximal right internal carotid artery. Control inflation was then performed to the micro inflation syringe device via micro tubing to 8 atmospheres achieving a diameter of 4 mm where it was maintained for approximately 20 seconds. The balloon was then deflated and retrieved and removed maintaining good distal positioning of the exchange micro guidewire. A control arteriogram performed through the balloon guide catheter in the right internal carotid artery origin demonstrated significantly improved caliber and flow through the right internal carotid artery proximal and distally. Mild to moderate irregularity in the petrous and cavernous segment of the right internal carotid artery probably represents intracranial arteriosclerosis. There is now free flow in the right middle cerebral artery proximally. However, there continues to be change in the prominent near occlusive filling defect in the distal right middle cerebral artery extending into the proximal superior and inferior divisions. Over the exchange micro guidewire, a combination of the Phenom 21 microcatheter inside of a 55 132 cm Zoom aspiration catheter was advanced to the cavernous right ICA. The exchange micro guidewire was removed. Over a 0.014 inch standard Synchro micro guidewire with a moderate J configuration, and a torque device access was obtained into the right middle cerebral artery and into the dominant inferior division M2-3 region followed by the microcatheter. The guidewire was removed. Good aspiration was obtained from the hub of the microcatheter. A gentle control arteriogram performed through the microcatheter demonstrated safe positioning of the tip of the microcatheter which was connected to continuous heparinized saline infusion. At this time, a 4 mm x 40 mm Solitaire X retrieval device was advanced to the distal end of microcatheter and deployed in the usual manner. Proximal portion of the device was just proximal to the  prominent filling defect in the distal right middle cerebral artery. Zoom aspiration catheter was advanced into the distal right M1 segment. With constant aspiration being applied at the hub of the Zoom aspiration catheter with a Penumbra aspiration device, and with a 20 mL syringe at the hub of the balloon guide catheter in the right internal carotid artery for proximal flow arrest for approximately 28 minutes, the combination of the retrieval device and the Zoom aspiration catheter was retrieved. Following reversal of flow arrest in the proximal right internal carotid artery, a control arteriogram performed demonstrated complete revascularization of the right internal carotid proximally and distally. A TICI 2C revascularization was achieved of the right middle cerebral artery with flash filling of the right anterior cerebral artery. Two moderate-sized sticky clots were identified entangled in the retrieval device. A 4/7 NAV Emboshield retrieval device was then prepped and purged in its housing with heparinized saline infusion. The device end was cleared of air as was the proximal portion of the delivery system. The filter device was captured into the delivery microcatheter. The combination was removed. A moderate J configuration was given to the distal end of the 014 inch micro guidewire. Thereafter, using biplane DSA mode with continuous heparinized saline infusion, combination of  the filter delivery catheter with the micro guidewire combination was removed was advanced without difficulty through the proximal right internal carotid artery into the distal cervical right ICA. The filter was then deployed in the usual manner without any difficulty. Under constant fluoroscopic guidance, the filter delivery microcatheter was retrieved and removed. A control arteriogram performed through the balloon guide catheter in the right common carotid artery demonstrated full apposition of the filter device to the right ICA. The  form of the proximal right internal carotid artery angioplastied segment. It was decided to proceed with deploying a 6/8 mm x 40 mm Xact stent. This was prepped retrogradely in its housing. Using the rapid exchange technique, the stent delivery apparatus was then advanced using the rapid exchange technique, and positioned with the proximal and distal markers adequate distance from the site of the severe stenosis. Stent was then deployed in the usual manner without any difficulty. With the filter kept stable distally the stent delivery apparatus was retrieved and removed. A control arteriogram performed through the balloon guide catheter in the right common carotid artery demonstrated excellent apposition proximally and distally. Given the waist at the site of the angioplasty, a 5 mm x 30 mm Viatrac 14 angioplasty balloon catheter which had been prepped as described above was advanced in a rapid exchange technique, and positioned within the deployed stent adequate distant from the site of the waist. The balloon was then expanded to 8 atmospheres where it was maintained for approximately 20 seconds. This was then deflated and removed. Control arteriogram performed through the balloon guide catheter in the right common carotid artery demonstrated excellent apposition proximally and distally intracranially without evidence of intraluminal filling defects. A filter capture device was then advanced using the rapid exchange technique to the proximal marker on the filter device. This was then captured by retrieving on the micro guidewire and the combination was retrieved and removed under constant fluoroscopic guidance. Control arteriograms were then performed at 10 and 20 minutes. The 20 minute injection demonstrated early caliber irregularity at the stent surface which necessitated the use of a bolus dose of IV cangrelor followed by infusion for 4 hours. CT was obtained prior to cangrelor IV dosing, and also following the  end of the procedure. Patient was loaded with aspirin 81 mg a day and Brilinta 180 mg via an orogastric tube prior to the angioplasty initially. CT scans demonstrated no evidence of intracranial hemorrhage or mass effect. A contrast stain was seen in the right parietal subcortical area on the final CT of the brain. A final control arteriogram performed through the balloon guide catheter in the right common carotid artery continued to demonstrate excellent flow through the stented segment intracranially without evidence of intraluminal filling defects or of occlusions. The balloon guide was then removed. The 8 French Pinnacle sheath was removed with successful hemostasis using an 8 French Angio-Seal closure device. Distal pulses remained palpable at the end of the procedure bilaterally. Patient's general anesthesia was then reversed, and the patient was extubated without difficulty. Upon recovery, the patient was beginning to move his left arm and leg and responded appropriately. He was then transferred to the neuro ICU for post revascularization care. 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 Status post  endovascular revascularization of occluded right internal carotid proximally with stent assisted angioplasty, and distal protection as described above. PLAN: Follow-up in the clinic 2 weeks post discharge. Electronically Signed   By: Luanne Bras M.D.   On: 04/20/2021 08:14   CT CEREBRAL PERFUSION W CONTRAST  Result Date: 04/18/2021 CLINICAL DATA:  Stroke. EXAM: CT PERFUSION BRAIN TECHNIQUE: Multiphase CT imaging of the brain was performed following IV bolus contrast injection. Subsequent parametric perfusion maps were  calculated using RAPID software. RADIATION DOSE REDUCTION: This exam was performed according to the departmental dose-optimization program which includes automated exposure control, adjustment of the mA and/or kV according to patient size and/or use of iterative reconstruction technique. CONTRAST:  42mL OMNIPAQUE IOHEXOL 350 MG/ML SOLN COMPARISON:  Noncontrast head CT and CT angiogram head/neck performed earlier today. FINDINGS: CT Brain Perfusion Findings: CBF (<30%) Volume: 20mL Perfusion (Tmax>6.0s) volume: 41mL Mismatch Volume: 49mL Infarct Core: 17 mL Infarction Location:Right MCA vascular territory. These results were communicated to Dr. Theda Sers At 4:14 pmon 2/5/2023by text page via the [facility] messaging system. IMPRESSION: The perfusion software identifies hypoperfused parenchyma within the right MCA vascular territory totaling 90 mL (utilizing the Tmax>6 seconds threshold). 17 mL core infarct within the right MCA vascular territory. Reported mismatch: 73 mL. Electronically Signed   By: Kellie Simmering D.O.   On: 04/18/2021 16:17   IR PERCUTANEOUS ART THROMBECTOMY/INFUSION INTRACRANIAL INC DIAG ANGIO  Result Date: 04/20/2021 INDICATION: Acute onset of right-sided gaze deviation, left-sided hemiplegia, dysarthria and left-sided neglect. 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 EXAM: 1. EMERGENT LARGE VESSEL OCCLUSION THROMBOLYSIS (POSTERIOR CIRCULATION) COMPARISON:  CT angiogram of the head and neck of April 18, 2021. MEDICATIONS: Vancomycin 1 g antibiotic was administered within 1 hour of the procedure. ANESTHESIA/SEDATION: General anesthesia. CONTRAST:  Omnipaque 300 approximately 150 mL. FLUOROSCOPY TIME:  Fluoroscopy Time: 47 minutes 30 seconds (2342 mGy). COMPLICATIONS: None immediate. TECHNIQUE: Following a full explanation of the  procedure along with the potential associated complications, an informed witnessed consent was obtained. The risks of intracranial hemorrhage of 10%, worsening neurological deficit, ventilator dependency, death and inability to revascularize were all reviewed in detail with the patient's daughters The patient was then put under general anesthesia by the Department of Anesthesiology at [facility]. The right groin was prepped and draped in the usual sterile fashion. Thereafter using modified Seldinger technique, transfemoral access into the right common femoral artery was obtained without difficulty. Over a 0.035 inch guidewire an 8 [place] Pinnacle 25 cm sheath was inserted. Through this, and also over a 0.035 inch guidewire a 5 [place] JB 1 catheter was advanced to the aortic arch region and selectively positioned in the common carotid artery and the innominate artery. FINDINGS: Innominate arteriogram demonstrates the right subclavian artery to be widely patent. Visualized right vertebral artery origin and distally is widely patent. Flow is noted in the basilar artery, the right posteroinferior cerebellar artery to the level of the superior cerebral arteries bilaterally on the lateral projection. The right common carotid artery proximally demonstrates wide patency. The right common carotid arteriogram demonstrates right external carotid artery and its major branches to be widely patent. Right internal carotid artery at the bulb and just distally demonstrates a complete angiographic occlusion without evidence of a delayed angiographic string sign. More distally, reconstitution of the right internal carotid artery supraclinoid segment is seen via the  right ophthalmic artery from the right external carotid artery branches. Flow is noted into the supraclinoid right ICA and also in the right middle cerebral artery. PROCEDURE: Diagnostic JB 1 catheter in the right common carotid artery was then exchanged over a  0.035 inch 300 cm Rosen exchange guidewire for an 087 95 cm balloon guide catheter which had been prepped with 50% contrast and 50% heparinized saline infusion. The guidewire was removed. Good aspiration obtained from the hub of balloon guide catheter in the distal right common carotid artery just proximal to the origin of the right internal carotid artery. Over a 0.014 inch standard Synchro micro guidewire with a moderate J configuration, an 021 160 cm Phenom microcatheter was advanced without difficulty to the proximal cavernous segment. The guidewire was removed. Good aspiration obtained from the hub of the Phenom microcatheter. A gentle control arteriogram performed through the microcatheter demonstrated antegrade flow into the cavernous, and supraclinoid segments. Right middle cerebral artery demonstrates near completely flow-limiting thrombus in the distal right M1 segment extending into the origin of the superior and inferior divisions. A 4 mm x 30 mm Viatrac 14 angioplasty balloon catheter which had been prepped with 50% contrast and 50% heparinized saline infusion antegradely, and with heparinized saline infusion retrogradely was then advanced over the exchange micro guidewire using the rapid exchange technique. Balloon was positioned such that the proximal and distal markers were just adequate distance from the site of the severe stenosis in the proximal right internal carotid artery. Control inflation was then performed to the micro inflation syringe device via micro tubing to 8 atmospheres achieving a diameter of 4 mm where it was maintained for approximately 20 seconds. The balloon was then deflated and retrieved and removed maintaining good distal positioning of the exchange micro guidewire. A control arteriogram performed through the balloon guide catheter in the right internal carotid artery origin demonstrated significantly improved caliber and flow through the right internal carotid artery proximal  and distally. Mild to moderate irregularity in the petrous and cavernous segment of the right internal carotid artery probably represents intracranial arteriosclerosis. There is now free flow in the right middle cerebral artery proximally. However, there continues to be change in the prominent near occlusive filling defect in the distal right middle cerebral artery extending into the proximal superior and inferior divisions. Over the exchange micro guidewire, a combination of the Phenom 21 microcatheter inside of a 55 132 cm Zoom aspiration catheter was advanced to the cavernous right ICA. The exchange micro guidewire was removed. Over a 0.014 inch standard Synchro micro guidewire with a moderate J configuration, and a torque device access was obtained into the right middle cerebral artery and into the dominant inferior division M2-3 region followed by the microcatheter. The guidewire was removed. Good aspiration was obtained from the hub of the microcatheter. A gentle control arteriogram performed through the microcatheter demonstrated safe positioning of the tip of the microcatheter which was connected to continuous heparinized saline infusion. At this time, a 4 mm x 40 mm Solitaire X retrieval device was advanced to the distal end of microcatheter and deployed in the usual manner. Proximal portion of the device was just proximal to the prominent filling defect in the distal right middle cerebral artery. Zoom aspiration catheter was advanced into the distal right M1 segment. With constant aspiration being applied at the hub of the Zoom aspiration catheter with a Penumbra aspiration device, and with a 20 mL syringe at the hub of the balloon guide catheter in  the right internal carotid artery for proximal flow arrest for approximately 28 minutes, the combination of the retrieval device and the Zoom aspiration catheter was retrieved. Following reversal of flow arrest in the proximal right internal carotid artery, a  control arteriogram performed demonstrated complete revascularization of the right internal carotid proximally and distally. A TICI 2C revascularization was achieved of the right middle cerebral artery with flash filling of the right anterior cerebral artery. Two moderate-sized sticky clots were identified entangled in the retrieval device. A 4/7 NAV Emboshield retrieval device was then prepped and purged in its housing with heparinized saline infusion. The device end was cleared of air as was the proximal portion of the delivery system. The filter device was captured into the delivery microcatheter. The combination was removed. A moderate J configuration was given to the distal end of the 014 inch micro guidewire. Thereafter, using biplane DSA mode with continuous heparinized saline infusion, combination of the filter delivery catheter with the micro guidewire combination was removed was advanced without difficulty through the proximal right internal carotid artery into the distal cervical right ICA. The filter was then deployed in the usual manner without any difficulty. Under constant fluoroscopic guidance, the filter delivery microcatheter was retrieved and removed. A control arteriogram performed through the balloon guide catheter in the right common carotid artery demonstrated full apposition of the filter device to the right ICA. The form of the proximal right internal carotid artery angioplastied segment. It was decided to proceed with deploying a 6/8 mm x 40 mm Xact stent. This was prepped retrogradely in its housing. Using the rapid exchange technique, the stent delivery apparatus was then advanced using the rapid exchange technique, and positioned with the proximal and distal markers adequate distance from the site of the severe stenosis. Stent was then deployed in the usual manner without any difficulty. With the filter kept stable distally the stent delivery apparatus was retrieved and removed. A  control arteriogram performed through the balloon guide catheter in the right common carotid artery demonstrated excellent apposition proximally and distally. Given the waist at the site of the angioplasty, a 5 mm x 30 mm Viatrac 14 angioplasty balloon catheter which had been prepped as described above was advanced in a rapid exchange technique, and positioned within the deployed stent adequate distant from the site of the waist. The balloon was then expanded to 8 atmospheres where it was maintained for approximately 20 seconds. This was then deflated and removed. Control arteriogram performed through the balloon guide catheter in the right common carotid artery demonstrated excellent apposition proximally and distally intracranially without evidence of intraluminal filling defects. A filter capture device was then advanced using the rapid exchange technique to the proximal marker on the filter device. This was then captured by retrieving on the micro guidewire and the combination was retrieved and removed under constant fluoroscopic guidance. Control arteriograms were then performed at 10 and 20 minutes. The 20 minute injection demonstrated early caliber irregularity at the stent surface which necessitated the use of a bolus dose of IV cangrelor followed by infusion for 4 hours. CT was obtained prior to cangrelor IV dosing, and also following the end of the procedure. Patient was loaded with aspirin 81 mg a day and Brilinta 180 mg via an orogastric tube prior to the angioplasty initially. CT scans demonstrated no evidence of intracranial hemorrhage or mass effect. A contrast stain was seen in the right parietal subcortical area on the final CT of the brain. A final control  arteriogram performed through the balloon guide catheter in the right common carotid artery continued to demonstrate excellent flow through the stented segment intracranially without evidence of intraluminal filling defects or of occlusions. The  balloon guide was then removed. The 8 French Pinnacle sheath was removed with successful hemostasis using an 8 French Angio-Seal closure device. Distal pulses remained palpable at the end of the procedure bilaterally. Patient's general anesthesia was then reversed, and the patient was extubated without difficulty. Upon recovery, the patient was beginning to move his left arm and leg and responded appropriately. He was then transferred to the neuro ICU for post revascularization care. 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 Status post endovascular revascularization of occluded right internal carotid proximally with stent assisted angioplasty, and distal protection as described above. PLAN: Follow-up in the clinic 2 weeks post discharge. Electronically Signed   By: Luanne Bras M.D.   On: 04/20/2021 08:14   CT HEAD CODE STROKE WO CONTRAST  Result Date: 04/18/2021 CLINICAL DATA:  Code stroke. Provided history: Neuro deficit, acute, stroke suspected. Additional history provided: Left-sided weakness, slurred speech, neglect. EXAM: CT HEAD WITHOUT CONTRAST TECHNIQUE: Contiguous axial images were obtained from the base of the skull through the vertex without intravenous contrast. RADIATION DOSE REDUCTION: This exam was performed according to the departmental dose-optimization program which includes automated exposure control, adjustment of the mA and/or kV according to patient size and/or use of iterative reconstruction technique. COMPARISON:  No pertinent prior exams available for comparison. FINDINGS: Brain: Cerebral volume is normal. Partially empty sella turcica. There is no acute intracranial hemorrhage. No demarcated  cortical infarct. No extra-axial fluid collection. No evidence of an intracranial mass. No midline shift. Vascular: A dense M1/M2 right MCA vessel is questioned (series 2, image 12). Atherosclerotic calcifications. Skull: Normal. Negative for fracture or focal lesion. Sinuses/Orbits: Visualized orbits show no acute finding. Moderate mucosal thickening within the bilateral ethmoid sinuses. Large left maxillary sinus mucous retention cyst. ASPECTS ([place] Stroke Program Early CT Score) - Ganglionic level infarction (caudate, lentiform nuclei, internal capsule, insula, M1-M3 cortex): 7 - Supraganglionic infarction (M4-M6 cortex): 3 Total score (0-10 with 10 being normal): 10 These results were communicated to Dr. Theda Sers At 3:35 pmon 2/5/2023by text page via the [facility] messaging system. IMPRESSION: No acute intracranial hemorrhage or acute demarcated cortical infarction. A dense M1/M2 right MCA is questioned, suspicious for endoluminal thrombus. Correlate with pending CT angiogram head/neck. Paranasal sinus disease, as described. Electronically Signed   By: Kellie Simmering D.O.   On: 04/18/2021 15:35   CT ANGIO HEAD NECK W WO CM W PERF (CODE STROKE)  Result Date: 04/18/2021 CLINICAL DATA:  Provided history: Neuro deficit, acute, stroke suspected; left-sided weakness, slurred speech and intermittent neglect. EXAM: CT ANGIOGRAPHY HEAD AND NECK TECHNIQUE: Multidetector CT imaging of the head and neck was performed using the standard protocol during bolus administration of intravenous contrast. Multiplanar CT image reconstructions and MIPs were obtained to evaluate the vascular anatomy. Carotid stenosis measurements (when applicable) are obtained utilizing NASCET criteria, using the distal internal carotid diameter as the denominator. RADIATION DOSE REDUCTION: This exam was performed according to the departmental dose-optimization program which includes automated exposure control, adjustment of the mA and/or kV according  to patient size and/or  use of iterative reconstruction technique. CONTRAST:  Administered contrast not known at this time. COMPARISON:  Noncontrast head CT performed immediately prior. FINDINGS: CTA NECK FINDINGS Aortic arch: Common origin of the innominate and left common carotid arteries. Atherosclerotic plaque within the proximal major branch vessels of the neck. No hemodynamically significant innominate or proximal subclavian artery stenosis. Right carotid system: The common carotid artery is patent. Atherosclerotic plaque within the distal CCA, about the carotid bifurcation and within the proximal ICA. 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 Left carotid system: CCA and ICA patent within the neck without significant stenosis (50% or greater). Atherosclerotic plaque within the left carotid system within the neck, most notably about the carotid bifurcation and within the proximal ICA. Vertebral arteries: Vertebral arteries patent within the neck without significant stenosis. Skeleton: Reversal of the expected cervical lordosis. Cervical spondylosis. No acute bony abnormality or aggressive osseous lesion. Other neck: No neck mass or cervical lymphadenopathy. Upper chest: No consolidation within the imaged lung apices. Review of the MIP images confirms the above findings CTA HEAD FINDINGS Anterior circulation: The right ICA remains occluded at the precavernous and cavernous levels. There is reconstitution of enhancement within the paraclinoid/supraclinoid right ICA. The intracranial left ICA is patent. Atherosclerotic plaque within this vessel with no more than mild stenosis. The M1 middle cerebral arteries are patent. Severe stenosis at the origin of an M2 right MCA vessel (series 7, image image 113). This may reflect the presence of endoluminal thrombus at this site. No right M2 proximal branch occlusion is identified. No left M2 proximal  branch occlusion or high-grade proximal stenosis is identified. The anterior cerebral arteries are patent. 2 mm inferior projecting vascular protrusion arising from the paraclinoid left ICA, which may reflect an aneurysm or infundibulum (series 7, image 121) (series 11, image 107). Posterior circulation: The intracranial vertebral arteries are patent. The basilar artery is patent. The posterior cerebral arteries are patent. Severe stenosis within the P2 right PCA (series 8, image 23) (moderate stenosis within the P2 left PCA. Posterior communicating arteries are diminutive or absent bilaterally. Venous sinuses: Within the limitations of contrast timing, no convincing thrombus. Anatomic variants: As described. Review of the MIP images confirms the above findings Attempts are being made to reach the ordering provider at this time. IMPRESSION: CTA neck: 1. 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 There is a somewhat tapered appearance leading up to the occlusion, and this may be secondary to dissection. 2. The left common carotid, left cervical internal carotid and bilateral vertebral arteries are patent within the neck without hemodynamically significant stenosis. Atherosclerotic plaque within the left carotid system, as described. CTA head: 1. 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 There is reconstitution of enhancement within the paraclinoid and supraclinoid right ICA. 2. Severe stenosis at the origin of an M2 right MCA vessel. There is an irregular appearance of the vessel at this site, and findings may reflect the presence of endoluminal thrombus. 3. Severe stenosis within the P2 right PCA. 4. Moderate stenosis within the P2 left PCA. 5. 2 mm inferiorly projecting vascular protrusion arising from the paraclinoid left ICA, which may reflect an aneurysm or infundibulum. Electronically Signed   By: Kellie Simmering D.O.   On:  04/18/2021 15:53      HISTORY OF PRESENT ILLNESS Gabryl Mccamy is a 56 y.o. male PMHx as reviewed below developed strange  sensation in his left hand about 1000 today which he called weirdness possibly not recognizing his left arm. He went to get up at 1430 and couldn't feel his left leg and he fell. EMS was called and on arrival noted slurred speech, L side weakness, R gaze, L side facial droop.   LKW: unclear tNK given: No unclear onset IR Thrombectomy yes Modified Rankin Scale: 0-Completely asymptomatic and back to baseline post- stroke NIHSS: 17 which improved to [place] Mr. Antjuan Rackham is a 56 y.o. male with history of hypertension, hyperlipidemia, diabetes, seizure admitted for left hand numbness, left leg weakness and fell. No tPA given due to OSW.   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  Patient counseled to be compliant with his antithrombotic medications Ongoing aggressive stroke risk factor management Therapy recommendations: Outpatient PT/OT Disposition: pt refused to stay and signed AMA   Carotid stenosis/occlusion CTA head and neck right ICA occlusion beyond the bulb IR status post right ICA stenting On aspirin the Brilinta   Diabetes HgbA1c 7.1 goal < 7.0 Uncontrolled CBG monitoring SSI Diabetic coordinator on board DM education and close PCP follow up for better DM control   Hypertension Unstable BP goal less than 140 for 24 hours post IR On  Cleviprex We will resume p.o. BP meds Long term BP goal normotensive   Hyperlipidemia Home meds: None LDL 155, goal < 70 Now on Lipitor 40 Continue statin at discharge   Tobacco abuse Current smoker Smoking cessation counseling provided Nicotine patch provided Pt is willing to quit   Other Stroke Risk Factors     Other Active Problems History of seizure, Dilantin was listed as home medication, however Dilantin level < 2.5, likely not taking at home    DISCHARGE EXAM Blood pressure 116/76, pulse 77, temperature 99.1 F (37.3 C), temperature source Axillary, resp. rate 18, weight 87.8 kg, SpO2 99 %.  General - Well nourished, well developed, in no apparent distress.   Ophthalmologic - fundi not visualized due to noncooperation.   Cardiovascular - Regular rhythm and rate.   Mental Status -  Level of arousal and orientation to time, place, and person were intact. Irritated and asking to go home Language including expression, naming, repetition, comprehension was assessed and found intact.   Cranial Nerves II - XII - II - left visual field hemianopia. III, IV, VI - Extraocular movements intact. V - Facial sensation intact bilaterally. VII - Facial movement intact bilaterally. VIII - Hearing & vestibular intact bilaterally. X - Palate elevates symmetrically. XI - Chin turning & shoulder shrug intact bilaterally. XII - Tongue protrusion intact.   Motor Strength - The patients strength was normal in all extremities and pronator drift was absent.  Bulk was normal and fasciculations were absent.   Motor Tone - Muscle tone was assessed at the neck and appendages and was normal.   Reflexes - The patients reflexes were symmetrical in all extremities and he had no pathological reflexes.   Sensory - Light touch,  temperature/pinprick were assessed and he has left sensory neglect.     Coordination - The patient had grossly normal movements in the hands with no ataxia.  Tremor  was absent.   Gait and Station - deferred.   DISCHARGE PLAN Disposition: left AMA  aspirin 81 mg daily and Brilinta (ticagrelor) 90 mg bid for secondary stroke prevention and post ICA stenting. Ongoing stroke risk factor control by Primary Care Physician at time of discharge Follow-up PCP in 2 weeks. Follow-up in [facility] in 4 weeks, office to schedule an appointment.   30 minutes were spent preparing discharge.  Rosalin Hawking, MD PhD Stroke Neurology 04/28/2021 4:15 PM

## 2021-04-29 ENCOUNTER — Other Ambulatory Visit (HOSPITAL_COMMUNITY): Payer: Self-pay | Admitting: Family Medicine

## 2021-04-29 DIAGNOSIS — I6322 Cerebral infarction due to unspecified occlusion or stenosis of basilar arteries: Secondary | ICD-10-CM

## 2021-05-10 ENCOUNTER — Inpatient Hospital Stay: Admission: RE | Admit: 2021-05-10 | Payer: Medicare Other | Source: Ambulatory Visit

## 2021-05-18 ENCOUNTER — Ambulatory Visit: Payer: Medicare Other | Admitting: Pharmacist

## 2021-05-18 ENCOUNTER — Telehealth: Payer: Self-pay

## 2021-05-18 ENCOUNTER — Other Ambulatory Visit (HOSPITAL_COMMUNITY): Payer: Self-pay

## 2021-05-18 ENCOUNTER — Ambulatory Visit: Payer: Medicare Other | Admitting: Internal Medicine

## 2021-05-18 NOTE — Telephone Encounter (Signed)
[facility] Patient Advocate Encounter ? ?Insurance verification completed.   ? ?The patient is insured through [employer] and has a $0.00 copay. ? ?Kern Reap is covered under pharmacy benefits, Prescription can be filled at [facility]. ? ?We will continue to follow to see if copay assistance is needed. ? ?Ileene Patrick, CPhT ?Specialty Pharmacy Patient Advocate ?[facility] ?Phone: [phone] ?Fax:  [phone]  ?

## 2021-05-19 ENCOUNTER — Ambulatory Visit (HOSPITAL_COMMUNITY): Admission: RE | Admit: 2021-05-19 | Payer: Medicare Other | Source: Ambulatory Visit

## 2021-05-31 NOTE — Progress Notes (Deleted)
?[facility] ?[address] ?[place]. [place] [address] ?[phone] ? ?     HOSPITAL FOLLOW UP NOTE ? ?Mr. George Joyce ?Date of Birth:  04/27/1965 ?Medical Record Number:  [id number]  ? ?Reason for Referral:  hospital stroke follow up ? ? ? ?SUBJECTIVE: ? ? ?CHIEF COMPLAINT:  ?No chief complaint on file. ? ? ?HPI:  ? ?Mr. George Joyce is a 56 y.o. male with history of hypertension, hyperlipidemia, diabetes, seizure who presented on 04/18/2021 for left hand numbness, left leg weakness and fell.  Personally reviewed hospitalization pertinent progress notes, lab work and imaging.  Evaluated by Dr. Erlinda Hong for right MCA stroke secondary to large vessel disease with right ICA occlusion and right MCA nonocclusive thrombus. S/p IR with TICI 2C reperfusion and right ICA stenting. LDL 155.  A1c 7.1.  Placed on aspirin and Brilinta and advised outpatient follow-up with IR.  Initiate atorvastatin 40 mg daily.  Tobacco cessation counseling provided.  No prior stroke history.  Residual deficits of left sensory neglect and left visual field hemianopia.  Therapy eval's recommended outpatient PT/OT.  Unfortunately, patient signed out AMA on 2/6 prior to completing MR brain and 2D echo.  ? ? ? ? ? ? ? ?PERTINENT IMAGING ? ?Per hospitalization 04/18/2021 ?CT no acute abnormality ?CTA head and neck right ICA occlusion beyond bulb, right M2 nonocclusive thrombus, right P2 severe stenosis, left P2 moderate stenosis ?CT perfusion 17/90 ?IR with TICI2c at left MCA and right ICA stenting ?MRI pending ?2D Echo pending ?LDL 155 ?HgbA1c 7.1 ? ? ? ?ROS:   ?14 system review of systems performed and negative with exception of *** ? ?PMH:  ?Past Medical History:  ?Diagnosis Date  ? Anxiety   ? Asthma   ? Depression   ? Diabetes mellitus   ? Dyslipidemia   ? GERD (gastroesophageal reflux disease)   ? Hypertension   ? Panic attack   ? Seizures ([place])   ? ? ?PSH:  ?Past Surgical History:  ?Procedure Laterality Date  ? IR CT HEAD LTD   04/18/2021  ? IR CT HEAD LTD  04/18/2021  ? IR PERCUTANEOUS ART THROMBECTOMY/INFUSION INTRACRANIAL INC DIAG ANGIO  04/18/2021  ? RADIOLOGY WITH ANESTHESIA N/A 04/18/2021  ? Procedure: RADIOLOGY WITH ANESTHESIA;  Surgeon: Radiologist, Medication, MD;  Location: [facility];  Service: Radiology;  Laterality: N/A;  ? ? ?Social History:  ?Social History  ? ?Socioeconomic History  ? Marital status: Single  ?  Spouse name: Not on file  ? Number of children: Not on file  ? Years of education: Not on file  ? Highest education level: Not on file  ?Occupational History  ? Not on file  ?Tobacco Use  ? Smoking status: Every Day  ? Smokeless tobacco: Not on file  ?Substance and Sexual Activity  ? Alcohol use: No  ? Drug use: Not on file  ? Sexual activity: Not on file  ?Other Topics Concern  ? Not on file  ?Social History Narrative  ? Not on file  ? ?Social Determinants of Health  ? ?Financial Resource Strain: Not on file  ?Food Insecurity: Not on file  ?Transportation Needs: Not on file  ?Physical Activity: Not on file  ?Stress: Not on file  ?Social Connections: Not on file  ?Intimate Partner Violence: Not on file  ? ? ?Family History: No family history on file. ? ?Medications:   ?Current Outpatient Medications on File Prior to Visit  ?Medication Sig Dispense Refill  ? albuterol (PROVENTIL HFA;VENTOLIN HFA)  108 (90 BASE) MCG/ACT inhaler Inhale 2 puffs into the lungs every 6 (six) hours as needed.    ? aspirin 81 MG chewable tablet Chew 1 tablet (81 mg total) by mouth daily. 30 tablet 11  ? atorvastatin (LIPITOR) 40 MG tablet Take 1 tablet (40 mg total) by mouth daily. 30 tablet 2  ? CARVEDILOL PO Take 1 tablet by mouth 2 (two) times daily.    ? ENALAPRIL MALEATE PO Take 1 tablet by mouth daily.    ? Fluticasone-Salmeterol (ADVAIR DISKUS IN) Inhale 1 puff into the lungs every morning.    ? HYDROCHLOROTHIAZIDE PO Take 1 tablet by mouth daily.    ? METFORMIN HCL PO Take 1 tablet by mouth daily.    ? metoCLOPramide (REGLAN) 10 MG tablet Take 1  tablet (10 mg total) by mouth every 6 (six) hours. 30 tablet 0  ? OVER THE COUNTER MEDICATION Take 2 tablets by mouth daily as needed. For Acid reflux    ? oxyCODONE-acetaminophen (PERCOCET/ROXICET) 5-325 MG tablet Take 1 tablet by mouth 3 (three) times daily as needed for pain.    ? pantoprazole (PROTONIX) 20 MG tablet Take 1 tablet (20 mg total) by mouth daily. 30 tablet 0  ? PRESCRIPTION MEDICATION Take 1 tablet by mouth 2 (two) times daily as needed. For panic attacks    ? PRESCRIPTION MEDICATION Take 1 tablet by mouth daily.    ? ticagrelor (BRILINTA) 90 MG TABS tablet Take 1 tablet (90 mg total) by mouth 2 (two) times daily. 60 tablet 2  ? XTAMPZA ER 13.5 MG C12A Take 13.5 mg by mouth 2 (two) times daily.    ? ?No current facility-administered medications on file prior to visit.  ? ? ?Allergies:   ?Allergies  ?Allergen Reactions  ? Penicillins   ? ? ? ? ?OBJECTIVE: ? ?Physical Exam ? ?There were no vitals filed for this visit. ?There is no height or weight on file to calculate BMI. ?No results found. ? ?No flowsheet data found.  ? ?General: well developed, well nourished, seated, in no evident distress ?Head: head normocephalic and atraumatic.   ?Neck: supple with no carotid or supraclavicular bruits ?Cardiovascular: regular rate and rhythm, no murmurs ?Musculoskeletal: no deformity ?Skin:  no rash/petichiae ?Vascular:  Normal pulses all extremities ?  ?Neurologic Exam ?Mental Status: Awake and fully alert. Oriented to place and time. Recent and remote memory intact. Attention span, concentration and fund of knowledge appropriate. Mood and affect appropriate.  ?Cranial Nerves: Fundoscopic exam reveals sharp disc margins. Pupils equal, briskly reactive to light. Extraocular movements full without nystagmus. Visual fields full to confrontation. Hearing intact. Facial sensation intact. Face, tongue, palate moves normally and symmetrically.  ?Motor: Normal bulk and tone. Normal strength in all tested extremity  muscles ?Sensory.: intact to touch , pinprick , position and vibratory sensation.  ?Coordination: Rapid alternating movements normal in all extremities. Finger-to-nose and heel-to-shin performed accurately bilaterally. ?Gait and Station: Arises from chair without difficulty. Stance is normal. Gait demonstrates normal stride length and balance with ***. Tandem walk and heel toe ***.  ?Reflexes: 1+ and symmetric. Toes downgoing.  ? ? ? ?NIHSS  *** ?Modified Rankin  *** ? ? ? ? ? ?ASSESSMENT: George Joyce is a 56 y.o. year old male with right MCA stroke secondary to large vessel disease on 04/18/2021 in setting of right ICA occlusion and right MCA nonocclusive thrombus s/p IR with TICI 2C reperfusion and right ICA stenting. Vascular risk factors include HTN, HLD, DM, carotid stenosis/occlusion,  tobacco use.  ? ? ? ? ?PLAN: ? ?*** : Residual deficit: ***. Continue {anticoagulants:31417}  and ***  for secondary stroke prevention.  Discussed secondary stroke prevention measures and importance of close PCP follow up for aggressive stroke risk factor management. I have gone over the pathophysiology of stroke, warning signs and symptoms, risk factors and their management in some detail with instructions to go to the closest emergency room for symptoms of concern. ?HTN: BP goal <130/90.  Stable on *** per PCP ?HLD: LDL goal <70. Recent LDL ***.  ?DMII: A1c goal<7.0. Recent A1c ***.  ? ? ? ?Follow up in *** or call earlier if needed ? ? ?CC:  ?[facility] provider: Dr. Leonie Man ?PCP: Default, Provider, MD   ? ?I spent *** minutes of face-to-face and non-face-to-face time with patient.  This included previsit chart review including review of recent hospitalization, lab review, study review, order entry, electronic health record documentation, patient education regarding recent stroke including etiology, secondary stroke prevention measures and importance of managing stroke risk factors, residual deficits and typical recovery time and  answered all other questions to patient satisfaction ? ? ?Frann Rider, AGNP-BC ? ?[facility] ?[place], [place] [address] ? ?Phone [phone] Fax [phone]

## 2021-06-01 ENCOUNTER — Encounter: Payer: Self-pay | Admitting: Adult Health

## 2021-06-01 ENCOUNTER — Inpatient Hospital Stay: Payer: Medicare Other | Admitting: Adult Health

## 2021-06-07 ENCOUNTER — Ambulatory Visit: Payer: Medicare Other | Admitting: Family

## 2021-06-16 ENCOUNTER — Ambulatory Visit: Payer: Medicare Other | Admitting: Pharmacist

## 2021-06-16 ENCOUNTER — Ambulatory Visit: Payer: Medicare Other | Admitting: Internal Medicine

## 2021-06-17 ENCOUNTER — Other Ambulatory Visit (HOSPITAL_COMMUNITY)
Admission: RE | Admit: 2021-06-17 | Discharge: 2021-06-17 | Disposition: A | Discharge status: Home / Self Care | Payer: Medicare Other | Source: Ambulatory Visit | Attending: Internal Medicine | Admitting: Internal Medicine

## 2021-06-17 ENCOUNTER — Encounter: Payer: Self-pay | Admitting: Internal Medicine

## 2021-06-17 ENCOUNTER — Ambulatory Visit (INDEPENDENT_AMBULATORY_CARE_PROVIDER_SITE_OTHER): Payer: Medicare Other | Admitting: Internal Medicine

## 2021-06-17 ENCOUNTER — Other Ambulatory Visit (HOSPITAL_COMMUNITY): Payer: Self-pay

## 2021-06-17 ENCOUNTER — Ambulatory Visit (INDEPENDENT_AMBULATORY_CARE_PROVIDER_SITE_OTHER): Payer: Medicare Other | Admitting: Pharmacist

## 2021-06-17 ENCOUNTER — Other Ambulatory Visit: Payer: Self-pay

## 2021-06-17 VITALS — BP 145/91 | HR 80 | Temp 98.6°F | Resp 16 | Ht 67.0 in | Wt 187.6 lb

## 2021-06-17 DIAGNOSIS — Z23 Encounter for immunization: Secondary | ICD-10-CM

## 2021-06-17 DIAGNOSIS — B20 Human immunodeficiency virus [HIV] disease: Secondary | ICD-10-CM

## 2021-06-17 DIAGNOSIS — Z113 Encounter for screening for infections with a predominantly sexual mode of transmission: Secondary | ICD-10-CM | POA: Insufficient documentation

## 2021-06-17 MED ORDER — BICTEGRAVIR-EMTRICITAB-TENOFOV 50-200-25 MG PO TABS
1.0000 | ORAL_TABLET | Freq: Every day | ORAL | 11 refills | Status: DC
  Filled 2021-06-17 (×2): qty 30, 30d supply, fill #0
  Filled 2021-08-23: qty 30, 30d supply, fill #1
  Filled 2021-09-13: qty 30, 30d supply, fill #2
  Filled 2021-10-05: qty 30, 30d supply, fill #3
  Filled 2022-02-15: qty 30, 30d supply, fill #4
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
## 2021-06-17 NOTE — Progress Notes (Signed)
?  [facility]  ? ? ? ? ?Reason for Consult: HIV +    ?Referring Physician: Health Department ? ? ? Patient ID: George Joyce, male    DOB: Mar 06, 1966, 56 y.o.   MRN: [id number] ? ?HPI:   ?Here as a new patient with a positive HIV Ab x 2.   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  He was also tested by his PCP and positive as well.  He tested negative remotely but no recent test.  Endorses heterosexual activity.  Recently with one male partner.  No issues with weight loss, no diarrhea.  No complaints.  He has discussed his diagnosis with an uncle in [place] who is positive and well-controlled and provided some reassurance.  He is interested in treatment.   ? ? ?Past Medical History:  ?Diagnosis Date  ? Anxiety   ? Asthma   ? Depression   ? Diabetes mellitus   ? Dyslipidemia   ? GERD (gastroesophageal reflux disease)   ? Hypertension   ? Panic attack   ? Seizures (HCC)   ? ? ?Prior to Admission medications   ?Medication Sig Start Date End Date Taking? Authorizing Provider  ?albuterol (PROVENTIL HFA;VENTOLIN HFA) 108 (90 BASE) MCG/ACT inhaler Inhale 2 puffs into the lungs every 6 (six) hours as needed.    [provider]  ?aspirin 81 MG chewable tablet Chew 1 tablet (81 mg total) by mouth daily. 04/20/21   Marvel Plan, MD  ?atorvastatin (LIPITOR) 40 MG tablet Take 1 tablet (40 mg total) by mouth daily. 04/19/21   Marvel Plan, MD  ?CARVEDILOL PO Take 1 tablet by mouth 2 (two) times daily.    [provider]  ?ENALAPRIL MALEATE PO Take 1 tablet by mouth daily.    [provider]  ?Fluticasone-Salmeterol (ADVAIR DISKUS IN) Inhale 1 puff into the lungs every morning.    [provider]  ?HYDROCHLOROTHIAZIDE PO Take 1 tablet by mouth daily.    [provider]  ?METFORMIN HCL PO Take 1 tablet by mouth daily.    [provider]  ?metoCLOPramide (REGLAN) 10 MG tablet Take 1 tablet (10 mg total) by mouth every 6 (six) hours. 10/02/11 10/12/11  Chilton Si, PA-C  ?OVER THE COUNTER MEDICATION Take 2 tablets by mouth daily as needed. For Acid reflux    [provider]  ?oxyCODONE-acetaminophen (PERCOCET/ROXICET) 5-325 MG tablet Take 1 tablet by mouth 3 (three) times daily as needed for pain. 04/02/21   [provider]  ?pantoprazole (PROTONIX) 20 MG tablet Take 1 tablet (20 mg total) by mouth daily. 10/02/11 10/01/12  Chilton Si, PA-C  ?PRESCRIPTION MEDICATION Take 1 tablet by mouth 2 (two) times daily as needed. For panic attacks    [provider]  ?PRESCRIPTION MEDICATION Take 1 tablet by mouth daily.    [provider]  ?ticagrelor (BRILINTA) 90 MG TABS tablet Take 1 tablet (90 mg total) by mouth 2 (two) times daily. 04/19/21   Marvel Plan, MD  ?[facility] ER 13.5 MG C12A Take 13.5 mg by mouth 2 (two) times daily. 04/02/21   [provider]  ? ? ?Allergies  ?Allergen Reactions  ? Aspirin Other (See Comments)  ?  Stomach bleeding ?Stomach bleeding ?  ? Pineapple Rash  ?  Bumps on stomach.  He can eat cooked pineapple. ?  Bumps on stomach.  He can eat cooked pineapple. ?  ? Pneumococcal Vaccine Rash  ? Penicillins   ? ? ?Social History  ? ?Tobacco Use  ? Smoking status: Every Day  ?Substance Use Topics  ? Alcohol use: No  ? ?FMH: + cardiac disease ? ?Review of Systems ? Constitutional: negative for fatigue, malaise, and anorexia ?Gastrointestinal: negative for nausea and diarrhea ?Integument/breast: negative for rash ?All other systems reviewed and are negative   ? ?Constitutional: in no apparent distress There were no vitals filed for this visit. ?EYES: anicteric ?ENMT: no thrush ?Respiratory: normal respiratory effort ?Musculoskeletal: no edema ?Skin: no rash ? ?Labs: ?Lab Results  ?Component Value Date  ? WBC 7.3 04/19/2021  ? HGB 13.3 04/19/2021  ? HCT 38.1 (L) 04/19/2021  ?  MCV 94.5 04/19/2021  ? PLT 236 04/19/2021  ?  ?Lab Results  ?Component Value Date  ? CREATININE 1.03 04/19/2021  ? BUN 8 04/19/2021  ? NA 135 04/19/2021  ? K 4.1 04/19/2021  ? CL 103 04/19/2021  ? CO2 21 (L) 04/19/2021  ?  ?Lab Results  ?Component Value Date  ? ALT 32 04/18/2021  ? AST 23 04/18/2021  ? ALKPHOS 48 04/18/2021  ? BILITOT 0.7 04/18/2021  ? INR 1.0 04/18/2021  ?  ? ?Assessment: New HIV patiient.  I discussed with him at length treatment options, outcomes on and off medications, life expectancy.  I discussed with him the care, follow up.  All questions answered.  Will do labs today and get him started withBiktarvy, rapid start.   ? ?Plan: ?1)  lab panel for new HIV ?2) Menveo and PCV23 ?3) condoms, bus passes ? ?

## 2021-06-18 ENCOUNTER — Other Ambulatory Visit (HOSPITAL_COMMUNITY): Payer: Self-pay

## 2021-06-18 LAB — URINE CYTOLOGY ANCILLARY ONLY
Chlamydia: NEGATIVE
Comment: NEGATIVE
Comment: NORMAL
Neisseria Gonorrhea: NEGATIVE

## 2021-06-18 LAB — T-HELPER CELL (CD4) - (RCID CLINIC ONLY)
CD4 % Helper T Cell: 27 % — ABNORMAL LOW (ref 33–65)
CD4 T Cell Abs: 479 /uL (ref 400–1790)

## 2021-06-18 MED ORDER — MELOXICAM 15 MG PO TABS: 15.0000 mg | ORAL_TABLET | Freq: Every day | ORAL | 0 refills | Status: AC

## 2021-06-18 MED ORDER — ALBUTEROL SULFATE HFA 108 (90 BASE) MCG/ACT IN AERS: INHALATION_SPRAY | RESPIRATORY_TRACT | 2 refills | Status: DC

## 2021-06-18 MED ORDER — ROSUVASTATIN CALCIUM 20 MG PO TABS: 20.0000 mg | ORAL_TABLET | Freq: Every evening | ORAL | 3 refills | Status: DC

## 2021-06-24 ENCOUNTER — Other Ambulatory Visit: Payer: Self-pay | Admitting: Pharmacist

## 2021-06-24 DIAGNOSIS — B20 Human immunodeficiency virus [HIV] disease: Secondary | ICD-10-CM

## 2021-06-24 MED ORDER — BIKTARVY 50-200-25 MG PO TABS: 1.0000 | ORAL_TABLET | Freq: Every day | ORAL | 0 refills | Status: AC

## 2021-06-24 NOTE — Progress Notes (Signed)
Medication Samples have been provided to the patient. ? ?Drug name: Biktarvy        ?Strength: 50/200/25 mg       ?Qty: 7 tablets (1 bottle)   ?LOT: [id number]   ?Exp.Date: 12/2022 ? ?Dosing instructions: Take one tablet by mouth once daily ? ?The patient has been instructed regarding the correct time, dose, and frequency of taking this medication, including desired effects and most common side effects.  ? ?Khizar Fiorella L. Mearl Olver, PharmD, BCIDP, AAHIVP, CPP ?Clinical Pharmacist Practitioner ?Infectious Diseases Clinical Pharmacist ?[facility] ?02/24/2020, 10:07 AM ? ?

## 2021-07-04 LAB — COMPLETE METABOLIC PANEL WITH GFR
AG Ratio: 1.3 (calc) (ref 1.0–2.5)
ALT: 27 U/L (ref 9–46)
AST: 24 U/L (ref 10–35)
Albumin: 4.2 g/dL (ref 3.6–5.1)
Alkaline phosphatase (APISO): 63 U/L (ref 35–144)
BUN: 14 mg/dL (ref 7–25)
CO2: 27 mmol/L (ref 20–32)
Calcium: 9.6 mg/dL (ref 8.6–10.3)
Chloride: 104 mmol/L (ref 98–110)
Creat: 1.04 mg/dL (ref 0.70–1.30)
Globulin: 3.2 g/dL (calc) (ref 1.9–3.7)
Glucose, Bld: 123 mg/dL — ABNORMAL HIGH (ref 65–99)
Potassium: 4 mmol/L (ref 3.5–5.3)
Sodium: 138 mmol/L (ref 135–146)
Total Bilirubin: 0.3 mg/dL (ref 0.2–1.2)
Total Protein: 7.4 g/dL (ref 6.1–8.1)
eGFR: 84 mL/min/{1.73_m2} (ref >=60)

## 2021-07-04 LAB — CBC WITH DIFFERENTIAL/PLATELET
Absolute Monocytes: 518 cells/uL (ref 200–950)
Basophils Absolute: 28 cells/uL (ref 0–200)
Basophils Relative: 0.4 %
Eosinophils Absolute: 400 cells/uL (ref 15–500)
Eosinophils Relative: 5.8 %
HCT: 39.6 % (ref 38.5–50.0)
Hemoglobin: 13.4 g/dL (ref 13.2–17.1)
Lymphs Abs: 1967 cells/uL (ref 850–3900)
MCH: 32.6 pg (ref 27.0–33.0)
MCHC: 33.8 g/dL (ref 32.0–36.0)
MCV: 96.4 fL (ref 80.0–100.0)
MPV: 10.6 fL (ref 7.5–12.5)
Monocytes Relative: 7.5 %
Neutro Abs: 3988 cells/uL (ref 1500–7800)
Neutrophils Relative %: 57.8 %
Platelets: 198 10*3/uL (ref 140–400)
RBC: 4.11 10*6/uL — ABNORMAL LOW (ref 4.20–5.80)
RDW: 12.5 % (ref 11.0–15.0)
Total Lymphocyte: 28.5 %
WBC: 6.9 10*3/uL (ref 3.8–10.8)

## 2021-07-04 LAB — HEPATITIS C ANTIBODY
Hepatitis C Ab: NONREACTIVE
SIGNAL TO CUT-OFF: 0.3 (ref <1.00)

## 2021-07-04 LAB — HEPATITIS B CORE ANTIBODY, TOTAL: Hep B Core Total Ab: NONREACTIVE

## 2021-07-04 LAB — QUANTIFERON-TB GOLD PLUS
Mitogen-NIL: >10 IU/mL
NIL: 0.06 IU/mL
QuantiFERON-TB Gold Plus: NEGATIVE
TB1-NIL: 0.02 IU/mL
TB2-NIL: 0.01 IU/mL

## 2021-07-04 LAB — HIV-1 GENOTYPING (RTI,PI,IN INHBTR)
Date Viral Load Collected: 4062023
HIV-1 Genotype: DETECTED — AB

## 2021-07-04 LAB — HEPATITIS A ANTIBODY, TOTAL: Hepatitis A AB,Total: NONREACTIVE

## 2021-07-04 LAB — HIV-1 RNA QUANT-NO REFLEX-BLD
HIV 1 RNA Quant: 34300 copies/mL — ABNORMAL HIGH
HIV-1 RNA Quant, Log: 4.54 Log copies/mL — ABNORMAL HIGH

## 2021-07-04 LAB — HEPATITIS B SURFACE ANTIBODY,QUALITATIVE: Hep B S Ab: NONREACTIVE

## 2021-07-04 LAB — RPR: RPR Ser Ql: NONREACTIVE

## 2021-07-04 LAB — HEPATITIS B SURFACE ANTIGEN: Hepatitis B Surface Ag: NONREACTIVE

## 2021-07-06 ENCOUNTER — Other Ambulatory Visit (HOSPITAL_COMMUNITY): Payer: Self-pay

## 2021-07-08 ENCOUNTER — Other Ambulatory Visit (HOSPITAL_COMMUNITY): Payer: Self-pay

## 2021-07-12 ENCOUNTER — Other Ambulatory Visit (HOSPITAL_COMMUNITY): Payer: Self-pay

## 2021-07-13 ENCOUNTER — Encounter: Payer: Self-pay | Admitting: Internal Medicine

## 2021-07-20 ENCOUNTER — Ambulatory Visit: Payer: Medicare Other | Admitting: Internal Medicine

## 2021-07-26 ENCOUNTER — Telehealth: Payer: Self-pay

## 2021-07-26 NOTE — Telephone Encounter (Signed)
Outgoing call made to patient to assist with scheduling missed appointment with Dr. Luciana Axe from last week. Patient states he will need transportation for his appointments. Call transferred to front desk to assist with transporting needs and scheduling appointment at the end of the week. ? ?George Joyce ? ?

## 2021-07-27 ENCOUNTER — Other Ambulatory Visit: Payer: Self-pay

## 2021-07-27 NOTE — Patient Outreach (Signed)
[facility]) Care Management ? ?07/27/2021 ? ?George Joyce ?11/25/1965 ?[id number] ? ? ?First telephone outreach attempt to obtain mRS. No answer. Unable to leave message for returned call. ? ?Vanice Sarah ?[facility]-Care Management Assistant ?[phone] ? ?

## 2021-07-29 ENCOUNTER — Other Ambulatory Visit: Payer: Self-pay

## 2021-07-29 NOTE — Patient Outreach (Signed)
[facility]) Care Management  07/29/2021  George Joyce March 02, 1966 [id number]   Second telephone outreach attempt to obtain mRS. No answer. Left message for returned call.  Vanice Sarah [facility] Management Assistant [phone]

## 2021-07-30 ENCOUNTER — Ambulatory Visit: Payer: Medicare Other | Admitting: Internal Medicine

## 2021-08-02 ENCOUNTER — Other Ambulatory Visit: Payer: Self-pay

## 2021-08-02 NOTE — Patient Outreach (Signed)
[facility]) Care Management  08/02/2021  Ido Glasson 10-09-1965 [id number]   Telephone outreach to patient to obtain mRS was successfully completed. MRS= [place] Care Management Assistant [phone]

## 2021-08-04 ENCOUNTER — Ambulatory Visit: Payer: Medicare Other | Admitting: Internal Medicine

## 2021-08-23 ENCOUNTER — Other Ambulatory Visit (HOSPITAL_COMMUNITY): Payer: Self-pay

## 2021-09-09 ENCOUNTER — Other Ambulatory Visit (HOSPITAL_COMMUNITY): Payer: Self-pay

## 2021-09-13 ENCOUNTER — Other Ambulatory Visit (HOSPITAL_COMMUNITY): Payer: Self-pay

## 2021-09-16 ENCOUNTER — Other Ambulatory Visit (HOSPITAL_COMMUNITY): Payer: Self-pay

## 2021-09-27 ENCOUNTER — Telehealth: Payer: Self-pay
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
## 2021-09-27 NOTE — Telephone Encounter (Signed)
Spoke with patient, he is not having any issue getting his medication from the pharmacy. Asked him to please call with any questions or concerns. Patient verbalized understanding and has no further questions.   Sandie Ano, RN

## 2021-10-05 ENCOUNTER — Other Ambulatory Visit (HOSPITAL_COMMUNITY): Payer: Self-pay

## 2021-10-18 ENCOUNTER — Other Ambulatory Visit (HOSPITAL_COMMUNITY): Payer: Self-pay

## 2021-11-11 ENCOUNTER — Other Ambulatory Visit (HOSPITAL_COMMUNITY): Payer: Self-pay

## 2021-12-07 ENCOUNTER — Other Ambulatory Visit (HOSPITAL_COMMUNITY): Payer: Self-pay

## 2022-02-09 ENCOUNTER — Other Ambulatory Visit (HOSPITAL_COMMUNITY): Payer: Self-pay

## 2022-02-09 ENCOUNTER — Telehealth: Payer: Self-pay | Admitting: Pharmacist

## 2022-02-09 NOTE — Telephone Encounter (Signed)
error 

## 2022-02-10 ENCOUNTER — Other Ambulatory Visit: Payer: Self-pay | Admitting: Pharmacist

## 2022-02-10 ENCOUNTER — Telehealth: Payer: Self-pay | Admitting: Pharmacist

## 2022-02-10 NOTE — Telephone Encounter (Signed)
Called patient regarding his request for Biktarvy. He has missed multiple appointments and was requesting a refill of his Biktarvy with inconsistent fill history. His number listed in Epic was not active. Patient's active phone number is [phone]. I initially was able to get in contact with the patient, however he stated he was in the middle of something and stated he would give me a call back shortly. Patient never called back, so a repeat call was made in the afternoon. The patient did not answer, so I left a HIPAA-compliant voicemail asking him to call the [facility] clinic back to make an appointment so we can continue filling his medication. The [facility] clinic phone number [phone]) was provided.   Jani Gravel, PharmD PGY-2 Infectious Diseases Resident  02/10/2022 4:02 PM

## 2022-02-15 ENCOUNTER — Other Ambulatory Visit (HOSPITAL_COMMUNITY): Payer: Self-pay

## 2022-02-15 ENCOUNTER — Other Ambulatory Visit: Payer: Self-pay

## 2022-02-15 NOTE — Telephone Encounter (Signed)
Patient called back today requesting 30-day fill of Biktarvy. Spoke with patient that it is important to see him in clinic and check his HIV labs to see how [name] is working for him prior to filling. He states he has no transportation at all and has no way of getting to the clinic. States he does not have anyone who could pick up bus passes for him either. Directed his profile to George Joyce, bridge counselor, to ask for assistance with transportation. Patient needs to be seen with Dr. Luciana Axe prior to filling further medication.   Additionally, George Joyce claims he has been taking his medicine all the way into October but admits that he has only received 4 bottles since April which are now all empty. Marchelle Folks

## 2022-02-15 NOTE — Telephone Encounter (Signed)
George Joyce has an intake appointment with him tomorrow and then will have him scheduled from there. Marchelle Folks

## 2022-02-16 ENCOUNTER — Other Ambulatory Visit (HOSPITAL_COMMUNITY): Payer: Self-pay

## 2022-02-17 ENCOUNTER — Other Ambulatory Visit: Payer: Self-pay

## 2022-02-17 ENCOUNTER — Other Ambulatory Visit (HOSPITAL_COMMUNITY): Payer: Self-pay

## 2022-02-17 ENCOUNTER — Ambulatory Visit (INDEPENDENT_AMBULATORY_CARE_PROVIDER_SITE_OTHER): Payer: Medicare Other | Admitting: Physician Assistant

## 2022-02-17 ENCOUNTER — Encounter: Payer: Self-pay | Admitting: Physician Assistant

## 2022-02-17 ENCOUNTER — Other Ambulatory Visit: Payer: Self-pay | Admitting: Pharmacist

## 2022-02-17 VITALS — BP 144/85 | HR 82 | Temp 98.3°F | Ht 67.0 in | Wt 180.0 lb

## 2022-02-17 DIAGNOSIS — F4310 Post-traumatic stress disorder, unspecified: Secondary | ICD-10-CM | POA: Diagnosis not present

## 2022-02-17 DIAGNOSIS — B20 Human immunodeficiency virus [HIV] disease: Secondary | ICD-10-CM

## 2022-02-17 DIAGNOSIS — Z79899 Other long term (current) drug therapy: Secondary | ICD-10-CM | POA: Diagnosis not present

## 2022-02-17 DIAGNOSIS — J4520 Mild intermittent asthma, uncomplicated: Secondary | ICD-10-CM | POA: Diagnosis not present

## 2022-02-17 MED ORDER — BICTEGRAVIR-EMTRICITAB-TENOFOV 50-200-25 MG PO TABS: 1.0000 | ORAL_TABLET | Freq: Every day | ORAL | 11 refills | Status: DC

## 2022-02-17 MED ORDER — BICTEGRAVIR-EMTRICITAB-TENOFOV 50-200-25 MG PO TABS
1.0000 | ORAL_TABLET | Freq: Every day | ORAL | 11 refills | Status: DC
  Filled 2022-02-17 – 2022-02-25 (×3): qty 30, 30d supply, fill #0
  Filled 2022-03-21 (×2): qty 30, 30d supply, fill #1
  Filled 2022-04-20: qty 30, 30d supply, fill #2
  Filled 2022-05-23: qty 30, 30d supply, fill #3
  Filled 2022-06-22: qty 30, 30d supply, fill #4
  Filled 2022-07-29: qty 30, 30d supply, fill #5
  Filled 2022-08-22: qty 30, 30d supply, fill #6
  Filled 2022-09-08: qty 30, 30d supply, fill #7
  Filled 2022-10-05: qty 30, 30d supply, fill #8
  Filled 2022-11-11: qty 30, 30d supply, fill #9
  Filled 2022-12-06: qty 30, 30d supply, fill #10
  Filled 2023-01-02: qty 30, 30d supply, fill #11

## 2022-02-17 MED ORDER — BIKTARVY 50-200-25 MG PO TABS: 1.0000 | ORAL_TABLET | Freq: Every day | ORAL | 0 refills | Status: AC

## 2022-02-17 MED ORDER — ALBUTEROL SULFATE HFA 108 (90 BASE) MCG/ACT IN AERS: 2.0000 | INHALATION_SPRAY | Freq: Four times a day (QID) | RESPIRATORY_TRACT | 0 refills | Status: AC | PRN

## 2022-02-17 NOTE — Progress Notes (Signed)
Medication Samples have been provided to the patient.  Drug name: Biktarvy        Strength: 50/200/25 mg       Qty: 7 tablets (1 bottle)   LOT: [id number]   Exp.Date: 05/2024  Dosing instructions: Take one tablet by mouth once daily  The patient has been instructed regarding the correct time, dose, and frequency of taking this medication, including desired effects and most common side effects.   Francina Beery L. Marielena Harvell, PharmD, BCIDP, AAHIVP, CPP Clinical Pharmacist Practitioner Infectious Diseases Clinical Pharmacist [facility] 02/24/2020, 10:07 AM  
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
## 2022-02-17 NOTE — Patient Instructions (Addendum)
George Joyce nice meeting you today!  Refilled biktarvy take this medication with our without food once daily. Do not take w antacids wait two hours after taking biktarvy.  This will be delivered at house Albuterol refill is at [facility] Follow up with primary care provider Mitch will assist with transportation Follow up with Dr. Luciana Axe in 6 weeks

## 2022-02-20 LAB — CBC WITH DIFFERENTIAL/PLATELET
Absolute Monocytes: 607 cells/uL (ref 200–950)
Basophils Absolute: 79 cells/uL (ref 0–200)
Basophils Relative: 0.9 %
Eosinophils Absolute: 537 cells/uL — ABNORMAL HIGH (ref 15–500)
Eosinophils Relative: 6.1 %
HCT: 41.7 % (ref 38.5–50.0)
Hemoglobin: 14.2 g/dL (ref 13.2–17.1)
Lymphs Abs: 2490 cells/uL (ref 850–3900)
MCH: 34 pg — ABNORMAL HIGH (ref 27.0–33.0)
MCHC: 34.1 g/dL (ref 32.0–36.0)
MCV: 99.8 fL (ref 80.0–100.0)
MPV: 10.2 fL (ref 7.5–12.5)
Monocytes Relative: 6.9 %
Neutro Abs: 5086 cells/uL (ref 1500–7800)
Neutrophils Relative %: 57.8 %
Platelets: 249 10*3/uL (ref 140–400)
RBC: 4.18 10*6/uL — ABNORMAL LOW (ref 4.20–5.80)
RDW: 11.8 % (ref 11.0–15.0)
Total Lymphocyte: 28.3 %
WBC: 8.8 10*3/uL (ref 3.8–10.8)

## 2022-02-20 LAB — LIPID PANEL
Cholesterol: 234 mg/dL — ABNORMAL HIGH (ref <200)
HDL: 57 mg/dL (ref >=40)
LDL Cholesterol (Calc): 149 mg/dL (calc) — ABNORMAL HIGH
Non-HDL Cholesterol (Calc): 177 mg/dL (calc) — ABNORMAL HIGH (ref <130)
Total CHOL/HDL Ratio: 4.1 (calc) (ref <5.0)
Triglycerides: 152 mg/dL — ABNORMAL HIGH (ref <150)

## 2022-02-20 LAB — COMPLETE METABOLIC PANEL WITH GFR
AG Ratio: 1.5 (calc) (ref 1.0–2.5)
ALT: 16 U/L (ref 9–46)
AST: 18 U/L (ref 10–35)
Albumin: 4.3 g/dL (ref 3.6–5.1)
Alkaline phosphatase (APISO): 52 U/L (ref 35–144)
BUN: 17 mg/dL (ref 7–25)
CO2: 25 mmol/L (ref 20–32)
Calcium: 9.7 mg/dL (ref 8.6–10.3)
Chloride: 105 mmol/L (ref 98–110)
Creat: 1.23 mg/dL (ref 0.70–1.30)
Globulin: 2.9 g/dL (calc) (ref 1.9–3.7)
Glucose, Bld: 98 mg/dL (ref 65–99)
Potassium: 4.2 mmol/L (ref 3.5–5.3)
Sodium: 138 mmol/L (ref 135–146)
Total Bilirubin: 0.2 mg/dL (ref 0.2–1.2)
Total Protein: 7.2 g/dL (ref 6.1–8.1)
eGFR: 68 mL/min/{1.73_m2} (ref >=60)

## 2022-02-20 LAB — T-HELPER CELLS (CD4) COUNT (NOT AT ARMC)
Absolute CD4: 902 cells/uL (ref 490–1740)
CD4 T Helper %: 34 % (ref 30–61)
Total lymphocyte count: 2672 cells/uL (ref 850–3900)

## 2022-02-20 LAB — HIV-1 RNA QUANT-NO REFLEX-BLD
HIV 1 RNA Quant: 228 Copies/mL — ABNORMAL HIGH
HIV-1 RNA Quant, Log: 2.36 Log cps/mL — ABNORMAL HIGH

## 2022-02-21 ENCOUNTER — Telehealth: Payer: Self-pay

## 2022-02-21 LAB — COLOGUARD

## 2022-02-21 LAB — EXTERNAL GENERIC LAB PROCEDURE

## 2022-02-21 NOTE — Telephone Encounter (Signed)
-----   Message from Horton Finer, [place] sent at 02/21/2022 10:10 AM EST ----- Please notify patient that VL is at 228, not as bad as anticipated having been without biktarvy for a few months. CD4 is 902, immune system is strong. Follow up with Dr. Luciana Axe as instructed.

## 2022-02-21 NOTE — Telephone Encounter (Signed)
Patient aware of results and voiced his understanding. ° °George Joyce P Aniket Paye, CMA ° °

## 2022-02-23 ENCOUNTER — Other Ambulatory Visit (HOSPITAL_COMMUNITY): Payer: Self-pay

## 2022-02-25 ENCOUNTER — Other Ambulatory Visit: Payer: Self-pay

## 2022-02-25 ENCOUNTER — Other Ambulatory Visit (HOSPITAL_COMMUNITY): Payer: Self-pay

## 2022-03-04 LAB — EXTERNAL GENERIC LAB PROCEDURE

## 2022-03-04 LAB — COLOGUARD

## 2022-03-21 ENCOUNTER — Other Ambulatory Visit (HOSPITAL_COMMUNITY): Payer: Self-pay

## 2022-03-22 ENCOUNTER — Other Ambulatory Visit: Payer: Self-pay

## 2022-03-23 ENCOUNTER — Other Ambulatory Visit: Payer: Self-pay

## 2022-03-30 ENCOUNTER — Ambulatory Visit: Payer: Medicare Other | Admitting: Internal Medicine

## 2022-04-12 ENCOUNTER — Encounter: Payer: Self-pay | Admitting: Internal Medicine

## 2022-04-12 ENCOUNTER — Ambulatory Visit (INDEPENDENT_AMBULATORY_CARE_PROVIDER_SITE_OTHER): Payer: 59 | Admitting: Internal Medicine

## 2022-04-12 ENCOUNTER — Other Ambulatory Visit: Payer: Self-pay

## 2022-04-12 VITALS — BP 118/86 | HR 86 | Temp 97.8°F | Ht 67.0 in | Wt 185.0 lb

## 2022-04-12 DIAGNOSIS — E1159 Type 2 diabetes mellitus with other circulatory complications: Secondary | ICD-10-CM

## 2022-04-12 DIAGNOSIS — F17200 Nicotine dependence, unspecified, uncomplicated: Secondary | ICD-10-CM

## 2022-04-12 DIAGNOSIS — B20 Human immunodeficiency virus [HIV] disease: Secondary | ICD-10-CM

## 2022-04-12 DIAGNOSIS — F17210 Nicotine dependence, cigarettes, uncomplicated: Secondary | ICD-10-CM | POA: Diagnosis not present

## 2022-04-12 DIAGNOSIS — Z23 Encounter for immunization: Secondary | ICD-10-CM | POA: Diagnosis not present

## 2022-04-12 DIAGNOSIS — I152 Hypertension secondary to endocrine disorders: Secondary | ICD-10-CM | POA: Diagnosis not present

## 2022-04-12 NOTE — Assessment & Plan Note (Signed)
Discussed cessation, particularly with his history of a CVA

## 2022-04-12 NOTE — Assessment & Plan Note (Signed)
He reports doing well and I reviewed the past labs with him Will check today and if ok, rtc in about 4 months

## 2022-04-12 NOTE — Assessment & Plan Note (Signed)
BP monitored and wnl today

## 2022-04-12 NOTE — Progress Notes (Signed)
   Subjective:    Patient ID: George Joyce, male    DOB: 1966-01-24, 57 y.o.   MRN: [id number]  HPI George Joyce is here for follow up of HIV He continues on [place] and denies any missed doses.  No new issues.  I saw him as a new patient in April 2023 and started on Biktarvy.  He did not follow up until December and had been off medications.  Now back on, reportedly doing well.    Review of Systems  Constitutional:  Negative for fatigue.  Gastrointestinal:  Negative for diarrhea.       Objective:   Physical Exam Eyes:     General: No scleral icterus. Pulmonary:     Effort: Pulmonary effort is normal.  Neurological:     Mental Status: He is alert.   SH: + tobacco        Assessment & Plan:

## 2022-04-13 LAB — T-HELPER CELL (CD4) - (RCID CLINIC ONLY)
CD4 % Helper T Cell: 35 % (ref 33–65)
CD4 T Cell Abs: 1164 /uL (ref 400–1790)

## 2022-04-14 LAB — HIV-1 RNA QUANT-NO REFLEX-BLD
HIV 1 RNA Quant: 20 Copies/mL — ABNORMAL HIGH
HIV-1 RNA Quant, Log: 1.31 Log cps/mL — ABNORMAL HIGH

## 2022-04-15 ENCOUNTER — Other Ambulatory Visit (HOSPITAL_COMMUNITY): Payer: Self-pay

## 2022-04-18 ENCOUNTER — Other Ambulatory Visit (HOSPITAL_COMMUNITY): Payer: Self-pay

## 2022-04-20 ENCOUNTER — Other Ambulatory Visit (HOSPITAL_COMMUNITY): Payer: Self-pay

## 2022-04-20 ENCOUNTER — Other Ambulatory Visit: Payer: Self-pay

## 2022-04-29 ENCOUNTER — Other Ambulatory Visit: Payer: Self-pay | Admitting: Internal Medicine

## 2022-04-29 ENCOUNTER — Telehealth: Payer: Self-pay

## 2022-04-29 DIAGNOSIS — F419 Anxiety disorder, unspecified: Secondary | ICD-10-CM

## 2022-04-29 NOTE — Telephone Encounter (Signed)
Error. Leatrice Jewels, RMA

## 2022-05-03 ENCOUNTER — Other Ambulatory Visit (HOSPITAL_COMMUNITY): Payer: Self-pay

## 2022-05-11 ENCOUNTER — Other Ambulatory Visit (HOSPITAL_COMMUNITY): Payer: Self-pay

## 2022-05-16 ENCOUNTER — Other Ambulatory Visit (HOSPITAL_COMMUNITY): Payer: Self-pay

## 2022-05-23 ENCOUNTER — Other Ambulatory Visit (HOSPITAL_COMMUNITY): Payer: Self-pay

## 2022-05-23 ENCOUNTER — Other Ambulatory Visit: Payer: Self-pay

## 2022-06-10 ENCOUNTER — Other Ambulatory Visit: Payer: Self-pay | Admitting: Family Medicine

## 2022-06-10 DIAGNOSIS — F17210 Nicotine dependence, cigarettes, uncomplicated: Secondary | ICD-10-CM

## 2022-06-16 ENCOUNTER — Other Ambulatory Visit (HOSPITAL_COMMUNITY): Payer: Self-pay

## 2022-06-20 ENCOUNTER — Other Ambulatory Visit (HOSPITAL_COMMUNITY): Payer: Self-pay

## 2022-06-22 ENCOUNTER — Other Ambulatory Visit (HOSPITAL_COMMUNITY): Payer: Self-pay

## 2022-06-23 ENCOUNTER — Other Ambulatory Visit: Payer: Self-pay

## 2022-06-24 NOTE — Progress Notes (Unsigned)
Psychiatric Initial Adult Assessment  Patient Identification: George Joyce MRN:  [id number] Date of Evaluation:  06/24/2022 Referral Source: Staci Righter, MD  Assessment:  George Joyce is a 57 y.o. male with a history of *** HIV on Biktarvy, CVA Feb 2023, TBI in 2000, HTN, T2DM, and asthma who presents to [facility] via video conferencing for initial evaluation of ***.  Patient reports ***  Plan:  # *** Past medication trials:  Status of problem: *** Interventions: -- ***  # *** Past medication trials:  Status of problem: *** Interventions: -- ***  # *** Past medication trials:  Status of problem: *** Interventions: -- ***  Patient was given contact information for behavioral health clinic and was instructed to call 911 for emergencies.   Subjective:  Chief Complaint: No chief complaint on file.   History of Present Illness:  ***  Chart review: Referred by infectious disease MD for PTSD and anxiety Feb 2024.   No psych meds on chart Past seizure?   Past Psychiatric History:  Diagnoses: *** Medication trials: *** Previous psychiatrist/therapist: *** Hospitalizations: *** Suicide attempts: *** SIB: *** Hx of violence towards others: *** Current access to guns: *** Hx of abuse: ***  Previous Psychotropic Medications: {YES/NO:21197}  Substance Abuse History in the last 12 months:  {yes no:314532}  Past Medical History:  Past Medical History:  Diagnosis Date   Anxiety    Asthma    Depression    Diabetes mellitus    Dyslipidemia    GERD (gastroesophageal reflux disease)    Hypertension    Panic attack    Seizures (HCC)     Past Surgical History:  Procedure Laterality Date   IR CT HEAD LTD  04/18/2021   IR CT HEAD LTD  04/18/2021   IR PERCUTANEOUS ART THROMBECTOMY/INFUSION INTRACRANIAL INC DIAG ANGIO  04/18/2021   RADIOLOGY WITH ANESTHESIA N/A 04/18/2021   Procedure: RADIOLOGY WITH ANESTHESIA;  Surgeon: Radiologist,  Medication, MD;  Location: [facility] OR;  Service: Radiology;  Laterality: N/A;    Family Psychiatric History: ***  Family History: No family history on file.  Social History:   Social History   Socioeconomic History   Marital status: Single    Spouse name: Not on file   Number of children: Not on file   Years of education: Not on file   Highest education level: Not on file  Occupational History   Not on file  Tobacco Use   Smoking status: Every Day    Packs/day: 1    Types: Cigarettes   Smokeless tobacco: Not on file   Tobacco comments:    Smokes a couple of cigarettes a day, trying to cut back  Substance and Sexual Activity   Alcohol use: No   Drug use: Not Currently   Sexual activity: Not Currently  Other Topics Concern   Not on file  Social History Narrative   Not on file   Social Determinants of Health   Financial Resource Strain: Not on file  Food Insecurity: Not on file  Transportation Needs: Unmet Transportation Needs (07/26/2021)   PRAPARE - Administrator, Civil Service (Medical): Yes    Lack of Transportation (Non-Medical): Yes  Physical Activity: Not on file  Stress: Not on file  Social Connections: Not on file    Additional Social History: updated  Allergies:   Allergies  Allergen Reactions   Aspirin Other (See Comments)    Stomach bleeding Stomach bleeding    Pineapple Rash  Bumps on stomach.  He can eat cooked pineapple. Bumps on stomach.  He can eat cooked pineapple.    Pneumococcal Vaccine Rash   Penicillins     Current Medications: Current Outpatient Medications  Medication Sig Dispense Refill   albuterol (VENTOLIN HFA) 108 (90 Base) MCG/ACT inhaler Inhale 2 puffs into the lungs every 6 (six) hours as needed. 1 each 0   aspirin 81 MG EC tablet Take 1 tablet (81 mg total) by mouth daily in the morning. 90 tablet 2   atorvastatin (LIPITOR) 40 MG tablet Take 1 tablet (40 mg total) by mouth daily. (Patient not taking: Reported on  04/12/2022) 30 tablet 2   bictegravir-emtricitabine-tenofovir AF (BIKTARVY) 50-200-25 MG TABS tablet Take 1 tablet by mouth daily. 30 tablet 11   CARVEDILOL PO Take 1 tablet by mouth 2 (two) times daily.     ENALAPRIL MALEATE PO Take 1 tablet by mouth daily.     Fluticasone-Salmeterol (ADVAIR DISKUS IN) Inhale 1 puff into the lungs every morning.     HYDROCHLOROTHIAZIDE PO Take 1 tablet by mouth daily.     meloxicam (MOBIC) 15 MG tablet Take 1 tablet (15 mg total) by mouth daily. 30 tablet 0   METFORMIN HCL PO Take 1 tablet by mouth daily.     metoCLOPramide (REGLAN) 10 MG tablet Take 1 tablet (10 mg total) by mouth every 6 (six) hours. 30 tablet 0   OVER THE COUNTER MEDICATION Take 2 tablets by mouth daily as needed. For Acid reflux     oxyCODONE-acetaminophen (PERCOCET/ROXICET) 5-325 MG tablet Take 1 tablet by mouth 3 (three) times daily as needed for pain.     pantoprazole (PROTONIX) 20 MG tablet Take 1 tablet (20 mg total) by mouth daily. 30 tablet 0   PRESCRIPTION MEDICATION Take 1 tablet by mouth 2 (two) times daily as needed. For panic attacks     PRESCRIPTION MEDICATION Take 1 tablet by mouth daily.     rosuvastatin (CRESTOR) 20 MG tablet Take 1 tablet (20 mg total) by mouth at bedtime.  *Discontinue atorvastatin** 90 tablet 3   ticagrelor (BRILINTA) 90 MG TABS tablet Take 1 tablet (90 mg total) by mouth 2 (two) times daily. 180 tablet 2   No current facility-administered medications for this visit.    ROS: Review of Systems  Objective:  Psychiatric Specialty Exam: There were no vitals taken for this visit.There is no height or weight on file to calculate BMI.  General Appearance: {Appearance:22683}  Eye Contact:  {BHH EYE CONTACT:22684}  Speech:  {Speech:22685}  Volume:  {Volume (PAA):22686}  Mood:  {BHH MOOD:22306}  Affect:  {Affect (PAA):22687}  Thought Content: {Thought Content:22690}   Suicidal Thoughts:  {ST/HT (PAA):22692}  Homicidal Thoughts:  {ST/HT (PAA):22692}   Thought Process:  {Thought Process (PAA):22688}  Orientation:  {BHH ORIENTATION (PAA):22689}    Memory:  {BHH MEMORY:22881}  Judgment:  {Judgement (PAA):22694}  Insight:  {Insight (PAA):22695}  Concentration:  {Concentration:21399}  Recall:  {BHH GOOD/FAIR/POOR:22877}  Fund of Knowledge: {BHH GOOD/FAIR/POOR:22877}  Language: {BHH GOOD/FAIR/POOR:22877}  Psychomotor Activity:  {Psychomotor (PAA):22696}  Akathisia:  {BHH YES OR NO:22294}  AIMS (if indicated): {Desc; done/not:10129}  Assets:  {Assets (PAA):22698}  ADL's:  {BHH ZOX'W:96045}  Cognition: {chl bhh cognition:304700322}  Sleep:  {BHH GOOD/FAIR/POOR:22877}   PE: General: sits comfortably in view of camera; no acute distress *** Pulm: no increased work of breathing on room air *** MSK: all extremity movements appear intact *** Neuro: no focal neurological deficits observed *** Gait & Station: unable to  assess by video ***   Metabolic Disorder Labs: Lab Results  Component Value Date   HGBA1C 7.1 (H) 04/19/2021   MPG 157.07 04/19/2021   No results found for: "PROLACTIN" Lab Results  Component Value Date   CHOL 234 (H) 02/17/2022   TRIG 152 (H) 02/17/2022   HDL 57 02/17/2022   CHOLHDL 4.1 02/17/2022   VLDL 45 (H) 04/19/2021   LDLCALC 149 (H) 02/17/2022   LDLCALC 155 (H) 04/19/2021   No results found for: "TSH"  Therapeutic Level Labs: No results found for: "LITHIUM" No results found for: "CBMZ" No results found for: "VALPROATE"  Screenings:  PHQ2-9    Flowsheet Row Office Visit from 04/12/2022 in [facility] for [facility]ious Disease Office Visit from 02/17/2022 in [facility] for Infectious Disease Office Visit from 06/17/2021 in [facility] for Infectious Disease  PHQ-2 Total Score 0 0 0       Collaboration of Care: Collaboration of Care: [facility] OP Collaboration of YKDX:[id number]}  Patient/Guardian was advised Release of Information must be obtained prior to any  record release in order to collaborate their care with an outside provider. Patient/Guardian was advised if they have not already done so to contact the registration department to sign all necessary forms in order for [place] to release information regarding their care.   Consent: Patient/Guardian gives verbal consent for treatment and assignment of benefits for services provided during this visit. Patient/Guardian expressed understanding and agreed to proceed.   Televisit via video: I connected with George Joyce on 06/24/22 at  9:00 AM EDT by a video enabled telemedicine application and verified that I am speaking with the correct person using two identifiers.  Location: Patient: *** Provider: remote office in [place]   I discussed the limitations of evaluation and management by telemedicine and the availability of in person appointments. The patient expressed understanding and agreed to proceed.  I discussed the assessment and treatment plan with the patient. The patient was provided an opportunity to ask questions and all were answered. The patient agreed with the plan and demonstrated an understanding of the instructions.   The patient was advised to call back or seek an in-person evaluation if the symptoms worsen or if the condition fails to improve as anticipated.  I provided *** minutes of non-face-to-face time during this encounter.  Eliya Bubar A Siona Coulston 4/12/20241:34 PM

## 2022-06-25 ENCOUNTER — Encounter (HOSPITAL_COMMUNITY): Payer: Self-pay

## 2022-06-25 ENCOUNTER — Encounter (HOSPITAL_COMMUNITY): Payer: 59 | Admitting: Psychiatry

## 2022-07-14 ENCOUNTER — Other Ambulatory Visit (HOSPITAL_COMMUNITY): Payer: Self-pay

## 2022-07-18 ENCOUNTER — Other Ambulatory Visit (HOSPITAL_COMMUNITY): Payer: Self-pay

## 2022-07-20 ENCOUNTER — Other Ambulatory Visit (HOSPITAL_COMMUNITY): Payer: Self-pay

## 2022-07-29 ENCOUNTER — Other Ambulatory Visit (HOSPITAL_COMMUNITY): Payer: Self-pay

## 2022-07-29 ENCOUNTER — Other Ambulatory Visit: Payer: Self-pay

## 2022-08-10 ENCOUNTER — Encounter: Payer: Self-pay | Admitting: Internal Medicine

## 2022-08-10 ENCOUNTER — Other Ambulatory Visit: Payer: Self-pay

## 2022-08-10 ENCOUNTER — Ambulatory Visit (INDEPENDENT_AMBULATORY_CARE_PROVIDER_SITE_OTHER): Payer: 59 | Admitting: Internal Medicine

## 2022-08-10 VITALS — BP 128/85 | HR 80 | Temp 96.8°F | Ht 67.5 in | Wt 175.0 lb

## 2022-08-10 DIAGNOSIS — F17210 Nicotine dependence, cigarettes, uncomplicated: Secondary | ICD-10-CM

## 2022-08-10 DIAGNOSIS — B20 Human immunodeficiency virus [HIV] disease: Secondary | ICD-10-CM | POA: Diagnosis not present

## 2022-08-10 DIAGNOSIS — F17200 Nicotine dependence, unspecified, uncomplicated: Secondary | ICD-10-CM

## 2022-08-10 DIAGNOSIS — I152 Hypertension secondary to endocrine disorders: Secondary | ICD-10-CM | POA: Diagnosis not present

## 2022-08-10 DIAGNOSIS — E1159 Type 2 diabetes mellitus with other circulatory complications: Secondary | ICD-10-CM

## 2022-08-10 NOTE — Assessment & Plan Note (Signed)
He is doing well now and I reviewed his last labs with him. Will repeat today to assure good compliance and if ok, he can return in 6 months.

## 2022-08-10 NOTE — Progress Notes (Signed)
   Subjective:    Patient ID: Maiko Ifill, male    DOB: 1966-01-24, 57 y.o.   MRN: [id number]  HPI Daveion is here for follow up of HIV He has been on Biktarvy now with no concerns.  No missed doses and tolerating well.  He is pleased with care and sees [facility] for primary care.  Last seen in January and his viral load was suppressed nicely. Continues to work with [employer] counseling.     Review of Systems  Constitutional:  Negative for fatigue.  Gastrointestinal:  Negative for diarrhea and nausea.  Skin:  Negative for rash.       Objective:   Physical Exam Eyes:     General: No scleral icterus. Pulmonary:     Effort: Pulmonary effort is normal.  Neurological:     Mental Status: He is alert.   SH: + tobacco        Assessment & Plan:

## 2022-08-10 NOTE — Assessment & Plan Note (Signed)
I discussed the importance of tobacco cessation for his health, particularly with his history of a CVA.  He is interested in quitting and going to try to get patches.   Encouraged.

## 2022-08-10 NOTE — Assessment & Plan Note (Signed)
BP monitored and ok today.  I emphasized the importance of quitting tobacco in reducing his bp.  He is taking his medication and will continue to follow with his pcp

## 2022-08-12 LAB — T-HELPER CELL (CD4) - (RCID CLINIC ONLY)
CD4 % Helper T Cell: 32 % — ABNORMAL LOW (ref 33–65)
CD4 T Cell Abs: 897 /uL (ref 400–1790)

## 2022-08-12 LAB — HIV-1 RNA QUANT-NO REFLEX-BLD
HIV 1 RNA Quant: NOT DETECTED Copies/mL
HIV-1 RNA Quant, Log: NOT DETECTED Log cps/mL

## 2022-08-16 ENCOUNTER — Other Ambulatory Visit (HOSPITAL_COMMUNITY): Payer: Self-pay

## 2022-08-19 ENCOUNTER — Other Ambulatory Visit (HOSPITAL_COMMUNITY): Payer: Self-pay

## 2022-08-22 ENCOUNTER — Other Ambulatory Visit: Payer: Self-pay

## 2022-08-22 ENCOUNTER — Other Ambulatory Visit (HOSPITAL_COMMUNITY): Payer: Self-pay

## 2022-09-08 ENCOUNTER — Other Ambulatory Visit (HOSPITAL_COMMUNITY): Payer: Self-pay

## 2022-09-19 ENCOUNTER — Other Ambulatory Visit (HOSPITAL_COMMUNITY): Payer: Self-pay

## 2022-10-05 ENCOUNTER — Other Ambulatory Visit (HOSPITAL_COMMUNITY): Payer: Self-pay

## 2022-10-14 ENCOUNTER — Other Ambulatory Visit: Payer: Self-pay

## 2022-11-08 ENCOUNTER — Other Ambulatory Visit (HOSPITAL_COMMUNITY): Payer: Self-pay

## 2022-11-11 ENCOUNTER — Other Ambulatory Visit (HOSPITAL_COMMUNITY): Payer: Self-pay

## 2022-11-16 ENCOUNTER — Other Ambulatory Visit (HOSPITAL_COMMUNITY): Payer: Self-pay

## 2022-11-16 NOTE — Therapy (Deleted)
OUTPATIENT PHYSICAL THERAPY SHOULDER EVALUATION   Patient Name: George Joyce MRN: [id number] DOB:1965-05-20, 57 y.o., male Today's Date: 11/16/2022  END OF SESSION:   Past Medical History:  Diagnosis Date   Anxiety    Asthma    Depression    Diabetes mellitus    Dyslipidemia    GERD (gastroesophageal reflux disease)    Hypertension    Panic attack    Seizures (HCC)    Past Surgical History:  Procedure Laterality Date   IR CT HEAD LTD  04/18/2021   IR CT HEAD LTD  04/18/2021   IR PERCUTANEOUS ART THROMBECTOMY/INFUSION INTRACRANIAL INC DIAG ANGIO  04/18/2021   RADIOLOGY WITH ANESTHESIA N/A 04/18/2021   Procedure: RADIOLOGY WITH ANESTHESIA;  Surgeon: Radiologist, Medication, MD;  Location: [facility] OR;  Service: Radiology;  Laterality: N/A;   Patient Active Problem List   Diagnosis Date Noted   Anxiety 04/29/2022   Human immunodeficiency virus (HIV) disease (HCC) 06/17/2021   Screening examination for venereal disease 06/17/2021   Embolic stroke involving carotid artery (HCC) 04/18/2021   Middle cerebral artery embolism, 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   Tobacco use disorder 01/22/2018   Hypertension associated with diabetes (HCC) 09/30/2015   Type 2 diabetes mellitus without complication, without long-term current use of insulin (HCC) 09/30/2015    PCP: ***  REFERRING PROVIDER: ***  REFERRING DIAG: ***  THERAPY DIAG:  No diagnosis found.  Rationale for Evaluation and Treatment: {HABREHAB:27488}  ONSET DATE: ***  SUBJECTIVE:                                                                                                                                                                                      SUBJECTIVE STATEMENT: *** Hand dominance: {MISC; OT HAND [phone]}  PERTINENT HISTORY: ***  PAIN:  Are you having pain? {OPRCPAIN:27236}  PRECAUTIONS: {Therapy precautions:24002}  RED FLAGS: {PT Red Flags:29287}   WEIGHT BEARING RESTRICTIONS: {Yes  ***/No:24003}  FALLS:  Has patient fallen in last 6 months? {fallsyesno:27318}  LIVING ENVIRONMENT: Lives with: {OPRC lives with:25569::"lives with their family"} Lives in: {Lives in:25570} Stairs: {opstairs:27293} Has following equipment at home: {Assistive devices:23999}  OCCUPATION: ***  PLOF: {PLOF:24004}  PATIENT GOALS:***  NEXT MD VISIT:   OBJECTIVE:   DIAGNOSTIC FINDINGS:  ***  PATIENT SURVEYS:  {rehab surveys:24030:a}  COGNITION: Overall cognitive status: {cognition:24006}     SENSATION: {sensation:27233}  POSTURE: ***  UPPER EXTREMITY ROM:   {AROM/PROM:27142} ROM Right eval Left eval  Shoulder flexion    Shoulder extension    Shoulder abduction    Shoulder adduction    Shoulder internal rotation    Shoulder external rotation    Elbow flexion    Elbow  extension    Wrist flexion    Wrist extension    Wrist ulnar deviation    Wrist radial deviation    Wrist pronation    Wrist supination    (Blank rows = not tested)  UPPER EXTREMITY MMT:  MMT Right eval Left eval  Shoulder flexion    Shoulder extension    Shoulder abduction    Shoulder adduction    Shoulder internal rotation    Shoulder external rotation    Middle trapezius    Lower trapezius    Elbow flexion    Elbow extension    Wrist flexion    Wrist extension    Wrist ulnar deviation    Wrist radial deviation    Wrist pronation    Wrist supination    Grip strength (lbs)    (Blank rows = not tested)  SHOULDER SPECIAL TESTS: Impingement tests: {shoulder impingement test:25231:a} SLAP lesions: {SLAP lesions:25232} Instability tests: {shoulder instability test:25233} Rotator cuff assessment: {rotator cuff assessment:25234} Biceps assessment: {biceps assessment:25235}  JOINT MOBILITY TESTING:  ***  PALPATION:  ***   TODAY'S TREATMENT:                                                                                                                                          DATE: ***   PATIENT EDUCATION: Education details: *** Person educated: {Person educated:25204} Education method: {Education Method:25205} Education comprehension: {Education Comprehension:25206}  HOME EXERCISE PROGRAM: ***  ASSESSMENT:  CLINICAL IMPRESSION: Patient is a *** y.o. *** who was seen today for physical therapy evaluation and treatment for ***.   OBJECTIVE IMPAIRMENTS: {opptimpairments:25111}.   ACTIVITY LIMITATIONS: {activitylimitations:27494}  PARTICIPATION LIMITATIONS: {participationrestrictions:25113}  PERSONAL FACTORS: {Personal factors:25162} are also affecting patient's functional outcome.   REHAB POTENTIAL: {rehabpotential:25112}  CLINICAL DECISION MAKING: {clinical decision making:25114}  EVALUATION COMPLEXITY: {Evaluation complexity:25115}   GOALS: Goals reviewed with patient? {yes/no:20286}  SHORT TERM GOALS: Target date: ***  *** Baseline: Goal status: INITIAL  2.  *** Baseline:  Goal status: INITIAL  3.  *** Baseline:  Goal status: INITIAL  4.  *** Baseline:  Goal status: INITIAL  5.  *** Baseline:  Goal status: INITIAL  6.  *** Baseline:  Goal status: INITIAL  LONG TERM GOALS: Target date: ***  *** Baseline:  Goal status: INITIAL  2.  *** Baseline:  Goal status: INITIAL  3.  *** Baseline:  Goal status: INITIAL  4.  *** Baseline:  Goal status: INITIAL  5.  *** Baseline:  Goal status: INITIAL  6.  *** Baseline:  Goal status: INITIAL  PLAN:  PT FREQUENCY: {rehab frequency:25116}  PT DURATION: {rehab duration:25117}  PLANNED INTERVENTIONS: {rehab planned interventions:25118::"Therapeutic exercises","Therapeutic activity","Neuromuscular re-education","Balance training","Gait training","Patient/Family education","Self Care","Joint mobilization"}  PLAN FOR NEXT SESSION: ***   Chanette Demo, PT 11/16/2022, 10:57 AM

## 2022-11-17 ENCOUNTER — Ambulatory Visit: Payer: 59 | Attending: Physical Therapy | Admitting: Physical Therapy

## 2022-12-06 ENCOUNTER — Other Ambulatory Visit (HOSPITAL_COMMUNITY): Payer: Self-pay

## 2022-12-06 ENCOUNTER — Other Ambulatory Visit: Payer: Self-pay

## 2022-12-06 NOTE — Progress Notes (Signed)
Specialty Pharmacy Refill Coordination Note  George Joyce is a 57 y.o. male contacted today regarding refills of specialty medication(s) Bictegravir-Emtricitab-Tenofov .  Patient requested Delivery  on 12/13/22  to verified address [address] [name] [address]   Medication will be filled on 12/12/22.

## 2022-12-13 ENCOUNTER — Ambulatory Visit: Payer: 59 | Admitting: Internal Medicine

## 2023-01-02 ENCOUNTER — Other Ambulatory Visit: Payer: Self-pay

## 2023-01-02 NOTE — Progress Notes (Signed)
Specialty Pharmacy Ongoing Clinical Assessment Note  George Joyce is a 57 y.o. male who is being followed by the specialty pharmacy service for RxSp HIV   Patient's specialty medication(s) reviewed today: Bictegravir-Emtricitab-Tenofov   Missed doses in the last 4 weeks: 0   Patient/Caregiver did not have any additional questions or concerns.   Therapeutic benefit summary: Patient is achieving benefit   Adverse events/side effects summary: No adverse events/side effects   Patient's therapy is appropriate to: Continue    Goals Addressed             This Visit's Progress    Achieve Undetectable HIV Viral Load < 20       Patient is on track. Patient will maintain adherence         Follow up:  6 months  Bobette Mo Specialty Pharmacist

## 2023-01-02 NOTE — Progress Notes (Signed)
Specialty Pharmacy Refill Coordination Note  George Joyce is a 57 y.o. male contacted today regarding refills of specialty medication(s) Bictegravir-Emtricitab-Tenofov   Patient requested Delivery   Delivery date: 01/06/23   Verified address: [address]   Medication will be filled on 01/05/23.

## 2023-01-03 ENCOUNTER — Inpatient Hospital Stay: Admission: RE | Admit: 2023-01-03 | Payer: 59 | Source: Ambulatory Visit

## 2023-01-04 LAB — COLOGUARD

## 2023-01-04 LAB — EXTERNAL GENERIC LAB PROCEDURE

## 2023-01-19 ENCOUNTER — Other Ambulatory Visit: Payer: Self-pay

## 2023-01-19 ENCOUNTER — Other Ambulatory Visit: Payer: Self-pay | Admitting: Physician Assistant

## 2023-01-19 DIAGNOSIS — B20 Human immunodeficiency virus [HIV] disease: Secondary | ICD-10-CM

## 2023-01-19 NOTE — Progress Notes (Signed)
Specialty Pharmacy Refill Coordination Note  George Joyce is a 57 y.o. male contacted today regarding refills of specialty medication(s) Bictegravir-Emtricitab-Tenofov   Patient requested Delivery   Delivery date: 02/01/23   Verified address: [address]   Medication will be filled on 01/31/23.     Pending refill request.

## 2023-01-23 ENCOUNTER — Other Ambulatory Visit: Payer: Self-pay

## 2023-01-23 ENCOUNTER — Other Ambulatory Visit: Payer: Self-pay | Admitting: Physician Assistant

## 2023-01-23 MED ORDER — BIKTARVY 50-200-25 MG PO TABS
1.0000 | ORAL_TABLET | Freq: Every day | ORAL | 11 refills | Status: DC
  Filled 2023-01-23: qty 30, 30d supply, fill #0
  Filled 2023-02-27: qty 30, 30d supply, fill #1
  Filled 2023-03-24: qty 30, 30d supply, fill #2
  Filled 2023-04-18: qty 30, 30d supply, fill #3
  Filled 2023-05-22 (×2): qty 30, 30d supply, fill #4
  Filled 2023-06-15: qty 30, 30d supply, fill #5
  Filled 2023-07-17: qty 30, 30d supply, fill #6
  Filled 2023-08-14: qty 30, 30d supply, fill #7
  Filled 2023-09-13: qty 30, 30d supply, fill #8
  Filled 2023-10-11: qty 30, 30d supply, fill #9

## 2023-01-31 ENCOUNTER — Other Ambulatory Visit: Payer: Self-pay

## 2023-02-08 ENCOUNTER — Other Ambulatory Visit: Payer: Self-pay

## 2023-02-20 ENCOUNTER — Other Ambulatory Visit (HOSPITAL_COMMUNITY): Payer: Self-pay

## 2023-02-24 IMAGING — CT CT HEAD W/O CM
3 of 4 series · 15 of 47 positions shown, 18 images · non-contrast
Comparison: 04/18/2021

CLINICAL DATA: Stroke, follow up



[Series 3: head 2.0 h70h · axial · 0.52mm/px · z∈[+1054,+1188]mm · 9 of 85 slices shown, 12 images]
[im 9/85  brain]
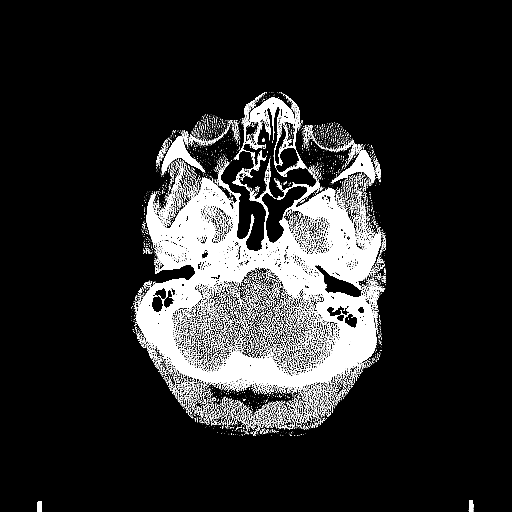
[im 9/85  bone]
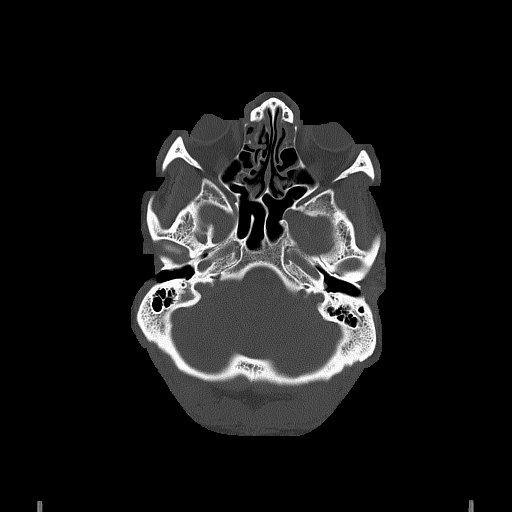
[im 17/85  brain]
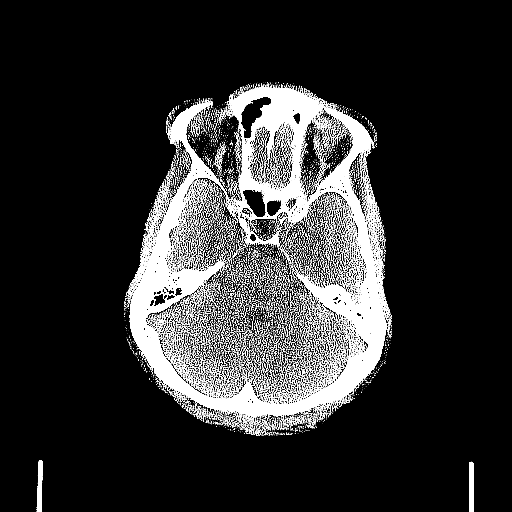
[im 26/85  brain]
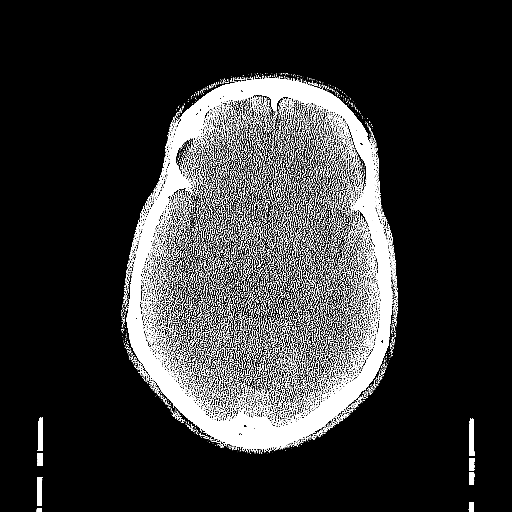
[im 34/85  brain]
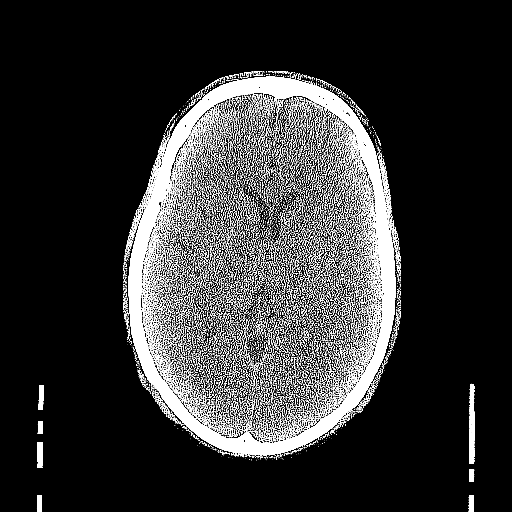
[im 43/85  brain]
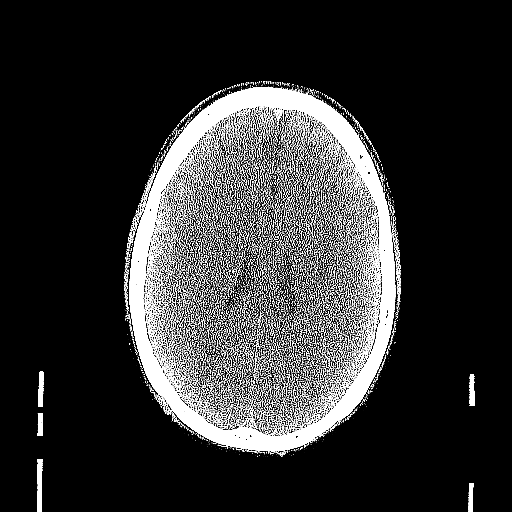
[im 43/85  bone]
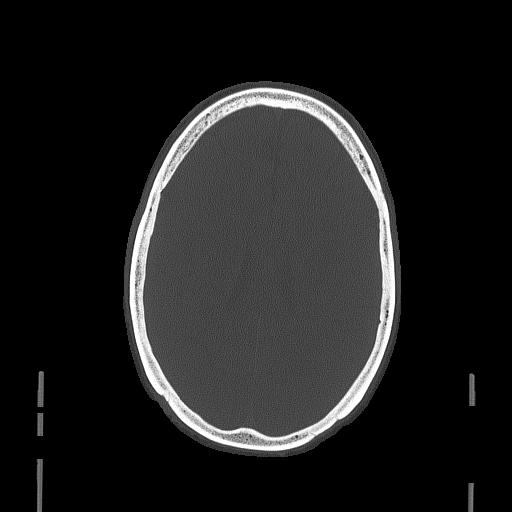
[im 51/85  brain]
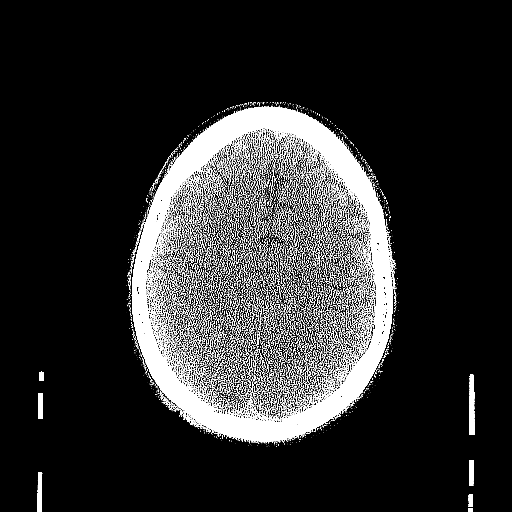
[im 59/85  brain]
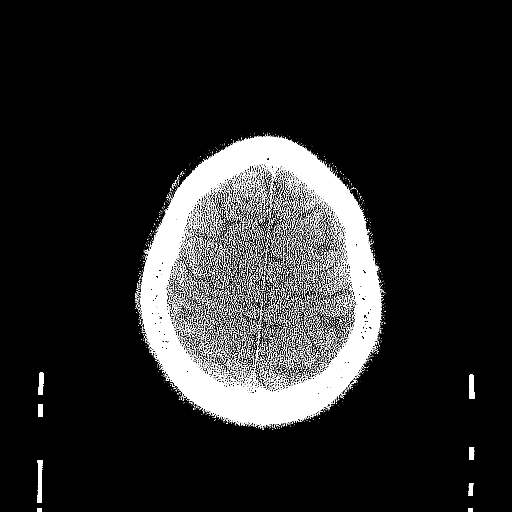
[im 68/85  brain]
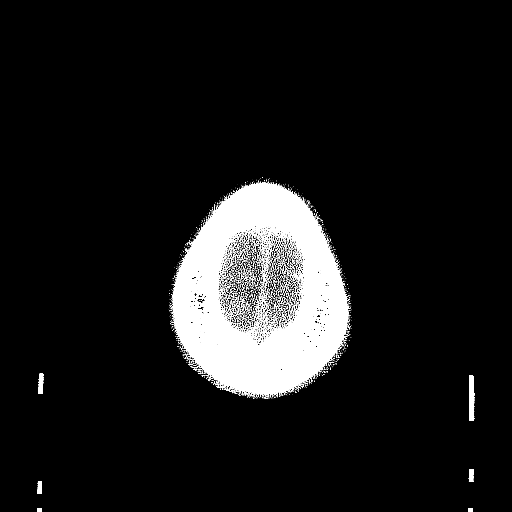
[im 76/85  brain]
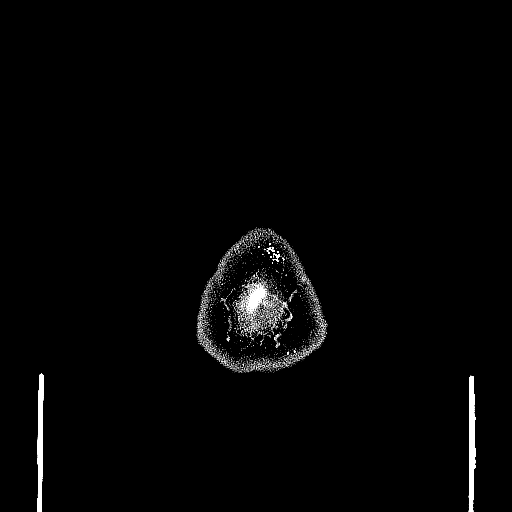
[im 76/85  bone]
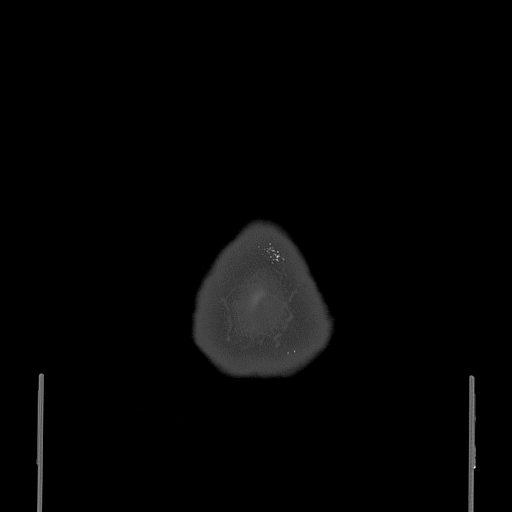

[Series 5: head 3.0 mpr cor · coronal · 0.33mm/px · 3 of 70 slices shown]
[im 24/70  brain]
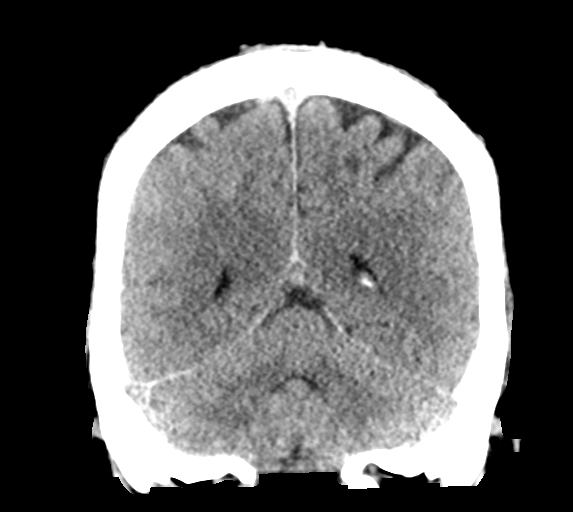
[im 31/70  brain]
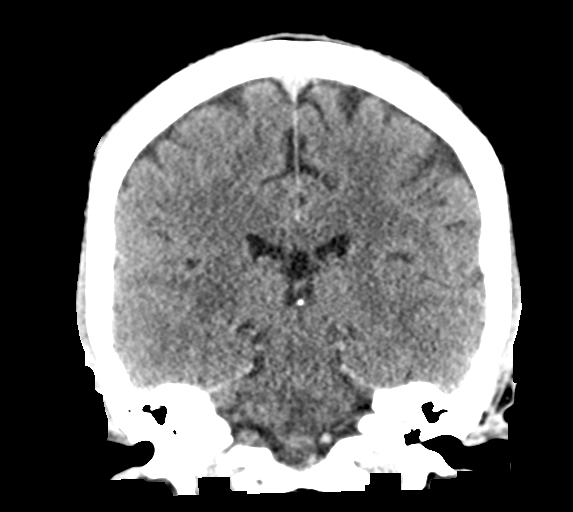
[im 39/70  brain]
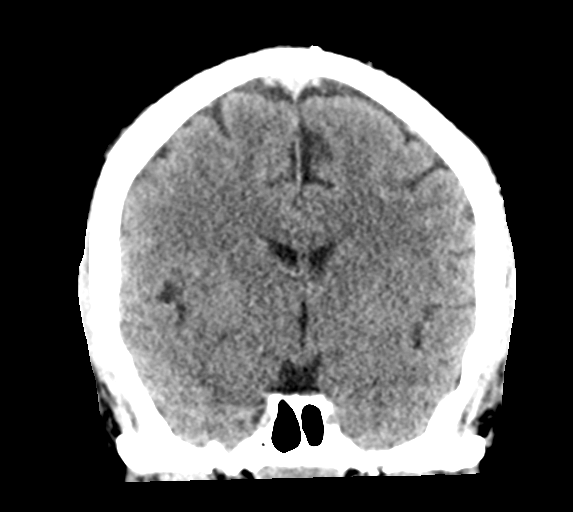

[Series 6: head 3.0 mpr sag · sagittal · 0.33mm/px · 3 of 54 slices shown]
[im 18/54  brain]
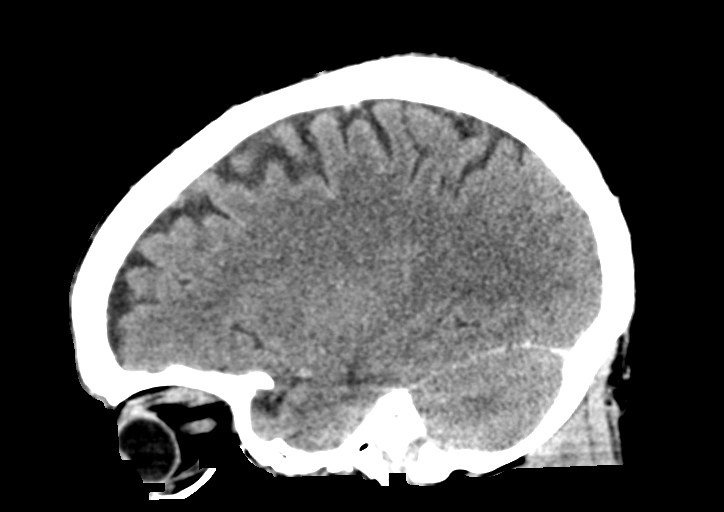
[im 27/54  brain]
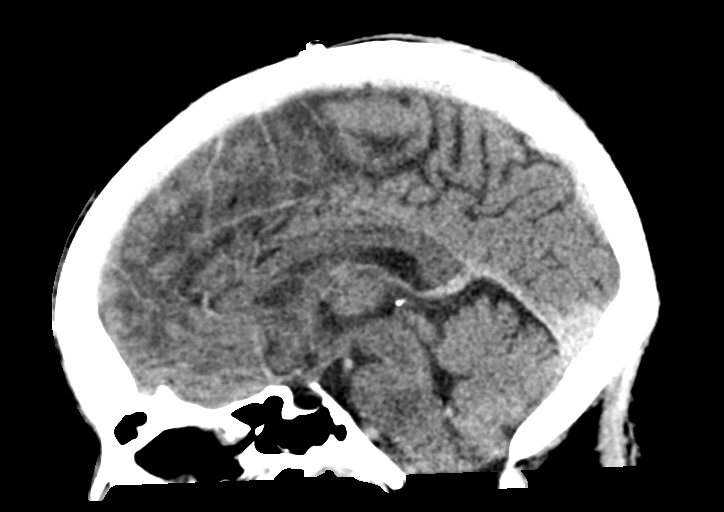
[im 36/54  brain]
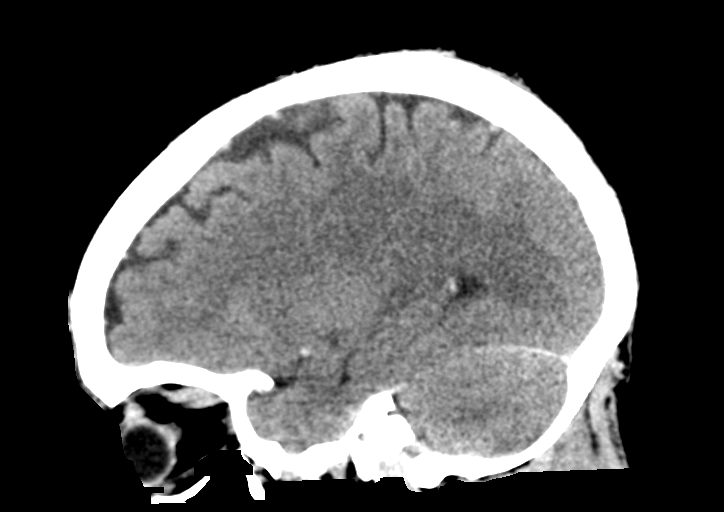

[15 of 47 positions shown; findings below may reference images not displayed]

FINDINGS: Brain: There is subtle subarachnoid hemorrhage within the right
parietal cortex with associated gyral thickening and effacement of
the cortical sulci in this region suspicious for changes of
reperfusion injury given recent intervention, less likely acute
infarction. No midline shift. Ventricular size is normal. Cerebellum
is unremarkable.

Vascular: No asymmetric hyperdense vasculature at the skull base.

Skull: Normal. Negative for fracture or focal lesion.

Sinuses/Orbits: Mild mucosal thickening within several ethmoid air
cells bilaterally. Remaining paranasal sinuses are clear. Orbits are
unremarkable.

Other: Mastoid air cells and middle ear cavities are clear.
IMPRESSION: Interval development of gyral thickening and subtle subarachnoid
hemorrhage involving the right parietal cortex suspicious for
changes of hyper perfusion/reperfusion injury, less likely acute
infarction. This could be confirmed with MRI examination.

## 2023-02-27 ENCOUNTER — Other Ambulatory Visit: Payer: Self-pay

## 2023-02-27 NOTE — Progress Notes (Signed)
Specialty Pharmacy Refill Coordination Note  George Joyce is a 57 y.o. male contacted today regarding refills of specialty medication(s) Bictegravir-Emtricitab-Tenofov [name])   Patient requested Delivery   Delivery date: 03/02/23   Verified address: [address] [place] [address]   Medication will be filled on 03/01/23.

## 2023-03-01 ENCOUNTER — Other Ambulatory Visit: Payer: Self-pay

## 2023-03-17 ENCOUNTER — Encounter: Payer: Self-pay | Admitting: Internal Medicine

## 2023-03-17 ENCOUNTER — Other Ambulatory Visit: Payer: Self-pay

## 2023-03-17 ENCOUNTER — Ambulatory Visit (INDEPENDENT_AMBULATORY_CARE_PROVIDER_SITE_OTHER): Payer: 59 | Admitting: Internal Medicine

## 2023-03-17 VITALS — BP 146/99 | HR 89 | Temp 98.0°F | Ht 67.5 in | Wt 178.0 lb

## 2023-03-17 DIAGNOSIS — B20 Human immunodeficiency virus [HIV] disease: Secondary | ICD-10-CM | POA: Diagnosis not present

## 2023-03-17 DIAGNOSIS — F17200 Nicotine dependence, unspecified, uncomplicated: Secondary | ICD-10-CM | POA: Diagnosis not present

## 2023-03-17 DIAGNOSIS — Z23 Encounter for immunization: Secondary | ICD-10-CM | POA: Diagnosis not present

## 2023-03-17 DIAGNOSIS — Z113 Encounter for screening for infections with a predominantly sexual mode of transmission: Secondary | ICD-10-CM | POA: Diagnosis not present

## 2023-03-17 NOTE — Assessment & Plan Note (Signed)
 Continues to do well with this and refills provided.  Will check his labs today to assure suppression and otherwise can return in 6 months.

## 2023-03-17 NOTE — Assessment & Plan Note (Signed)
 Discussed flu and COVID vaccines and given today

## 2023-03-17 NOTE — Assessment & Plan Note (Signed)
 Again discussed with him today the need to quit smoking.  He is contemplative and interested in using patches which he actually has been using though along with cigarette smoking.  I did encourage him to quit and he is very interested to do so.

## 2023-03-17 NOTE — Progress Notes (Signed)
   Subjective:    Patient ID: George Joyce, male    DOB: 07-17-1965, 58 y.o.   MRN: [id number]  HPI George Joyce is here for follow-up of HIV He has continued on Biktarvy  and denies any missed doses.  He is doing well with this and has had no issues getting or taking his medications.  He does continue to smoke cigarettes.  He continues to follow his primary care physician as well as pain specialist.  No complaints today.   Review of Systems  Constitutional:  Negative for fatigue.  Gastrointestinal:  Negative for diarrhea.  Skin:  Negative for rash.       Objective:   Physical Exam Eyes:     General: No scleral icterus. Pulmonary:     Effort: Pulmonary effort is normal.  Neurological:     Mental Status: He is alert.   SH: + tobacco        Assessment & Plan:

## 2023-03-17 NOTE — Assessment & Plan Note (Signed)
 Screen today.  He has 1 stable male partner

## 2023-03-20 LAB — C. TRACHOMATIS/N. GONORRHOEAE RNA
C. trachomatis RNA, TMA: NOT DETECTED
N. gonorrhoeae RNA, TMA: DETECTED — AB

## 2023-03-20 LAB — HIV-1 RNA QUANT-NO REFLEX-BLD
HIV 1 RNA Quant: <20 {copies}/mL — ABNORMAL HIGH
HIV-1 RNA Quant, Log: <1.3 {Log_copies}/mL — ABNORMAL HIGH

## 2023-03-20 LAB — RPR: RPR Ser Ql: NONREACTIVE

## 2023-03-20 LAB — T-HELPER CELLS (CD4) COUNT (NOT AT ARMC)
Absolute CD4: 919 {cells}/uL (ref 490–1740)
CD4 T Helper %: 29 % — ABNORMAL LOW (ref 30–61)
Total lymphocyte count: 3192 {cells}/uL (ref 850–3900)

## 2023-03-21 ENCOUNTER — Other Ambulatory Visit: Payer: Self-pay

## 2023-03-22 ENCOUNTER — Telehealth: Payer: Self-pay

## 2023-03-22 ENCOUNTER — Ambulatory Visit: Payer: 59

## 2023-03-22 NOTE — Telephone Encounter (Signed)
-----   Message from Belia Heman Comer sent at 03/22/2023  3:17 PM EST ----- Positive for gonorrhea and needs ceftriaxone 500 mg IM x 1.  thanks

## 2023-03-22 NOTE — Telephone Encounter (Signed)
 Called patient regarding STD results. Will come in today for treatment. George Joyce, RMA

## 2023-03-23 ENCOUNTER — Other Ambulatory Visit: Payer: Self-pay

## 2023-03-23 ENCOUNTER — Ambulatory Visit (INDEPENDENT_AMBULATORY_CARE_PROVIDER_SITE_OTHER): Payer: 59

## 2023-03-23 DIAGNOSIS — A549 Gonococcal infection, unspecified: Secondary | ICD-10-CM

## 2023-03-23 MED ORDER — CEFTRIAXONE SODIUM 500 MG IJ SOLR
500.0000 mg | Freq: Once | INTRAMUSCULAR | Status: AC
Start: 2023-03-23 — End: 2023-03-23
  Administered 2023-03-23: 500 mg via INTRAMUSCULAR

## 2023-03-24 ENCOUNTER — Other Ambulatory Visit: Payer: Self-pay

## 2023-03-24 NOTE — Progress Notes (Signed)
 Specialty Pharmacy Refill Coordination Note  Hartman Minahan is a 58 y.o. male contacted today regarding refills of specialty medication(s) Bictegravir-Emtricitab-Tenofov (Biktarvy )   Patient requested Delivery   Delivery date: 03/28/23   Verified address: [address]   [place] [address]   Medication will be filled on 03/27/23.

## 2023-04-18 ENCOUNTER — Other Ambulatory Visit: Payer: Self-pay

## 2023-04-18 NOTE — Progress Notes (Signed)
 Specialty Pharmacy Refill Coordination Note  George Joyce is a 58 y.o. male contacted today regarding refills of specialty medication(s) Bictegravir-Emtricitab-Tenofov (Biktarvy )   Patient requested Delivery   Delivery date: 04/25/23   Verified address: [address]   [place] [address]   Medication will be filled on 02.10.25.

## 2023-04-24 ENCOUNTER — Other Ambulatory Visit: Payer: Self-pay

## 2023-04-24 NOTE — Progress Notes (Signed)
 Sent mychart message to inform of delay, shipping 2/11

## 2023-05-18 ENCOUNTER — Other Ambulatory Visit: Payer: Self-pay

## 2023-05-22 ENCOUNTER — Other Ambulatory Visit: Payer: Self-pay

## 2023-05-22 ENCOUNTER — Other Ambulatory Visit (HOSPITAL_COMMUNITY): Payer: Self-pay

## 2023-05-22 NOTE — Progress Notes (Signed)
 Specialty Pharmacy Refill Coordination Note  George Joyce is a 58 y.o. male contacted today regarding refills of specialty medication(s) Bictegravir-Emtricitab-Tenofov [name])   Patient requested Delivery   Delivery date: 05/24/23   Verified address: [address]   [place] [address]   Medication will be filled on 05/23/23.

## 2023-06-15 ENCOUNTER — Other Ambulatory Visit (HOSPITAL_COMMUNITY): Payer: Self-pay

## 2023-06-15 NOTE — Progress Notes (Signed)
 Specialty Pharmacy Refill Coordination Note  George Joyce is a 58 y.o. male contacted today regarding refills of specialty medication(s) Bictegravir-Emtricitab-Tenofov [name])   Patient requested Delivery   Delivery date: 06/21/23   Verified address: [address]   [place] [address]   Medication will be filled on 06/20/23.

## 2023-06-15 NOTE — Progress Notes (Signed)
 Specialty Pharmacy Ongoing Clinical Assessment Note  George Joyce is a 58 y.o. male who is being followed by the specialty pharmacy service for RxSp HIV   Patient's specialty medication(s) reviewed today: Bictegravir-Emtricitab-Tenofov (Biktarvy)   Missed doses in the last 4 weeks: 0   Patient/Caregiver did not have any additional questions or concerns.   Therapeutic benefit summary: Patient is achieving benefit   Adverse events/side effects summary: No adverse events/side effects   Patient's therapy is appropriate to: Continue    Goals Addressed             This Visit's Progress    Achieve Undetectable HIV Viral Load < 20   On track    Patient is on track. Patient will maintain adherence. Viral load remains undetectable as of 03/17/23.         Follow up:  6 months  Servando Snare Specialty Pharmacist

## 2023-06-23 ENCOUNTER — Telehealth: Payer: Self-pay

## 2023-06-23 NOTE — Telephone Encounter (Signed)
 Unfortunately, if he is taking Dilantin, the majority of ART regimens will be contraindicated due to decreased levels and efficacy. He really needs to meet with that prescriber and discuss transitioning to an alternative agent. Will discuss this with him during his next appointment. I am truly surprised he is still undetectable in January if he started phenytoin in November.

## 2023-06-23 NOTE — Telephone Encounter (Signed)
 Received notification from Arvilla Meres, PA of [facility] with Biktarvy and Dilantin.   Called and spoke with patient, he is not taking his Dilantin as prescribed. Reports he is only taking 1 capsule a day instead of three.   Discussed potential for development of resistance to component of Biktarvy. He also expresses interest in [place]. Will come in to discuss with pharmacy team on 4/15.  Linna Hoff, BSN, RN

## 2023-06-26 NOTE — Telephone Encounter (Signed)
 No, I will definitely see him this week to discuss this drug interaction - thanks!

## 2023-06-27 ENCOUNTER — Telehealth: Payer: Self-pay

## 2023-06-27 ENCOUNTER — Other Ambulatory Visit (HOSPITAL_COMMUNITY)
Admission: RE | Admit: 2023-06-27 | Discharge: 2023-06-27 | Disposition: A | Discharge status: Home / Self Care | Source: Ambulatory Visit | Attending: Infectious Disease | Admitting: Infectious Disease

## 2023-06-27 ENCOUNTER — Other Ambulatory Visit: Payer: Self-pay

## 2023-06-27 ENCOUNTER — Ambulatory Visit (INDEPENDENT_AMBULATORY_CARE_PROVIDER_SITE_OTHER): Admitting: Pharmacist

## 2023-06-27 DIAGNOSIS — B20 Human immunodeficiency virus [HIV] disease: Secondary | ICD-10-CM | POA: Insufficient documentation

## 2023-06-27 NOTE — Telephone Encounter (Signed)
 Called [facility] and [facility] providers to discuss Biktarvy-Dilantin interaction. Left call back number with both.   Kristopher Pheasant PharmD Candidate

## 2023-06-27 NOTE — Progress Notes (Signed)
 HPI: George Joyce is a 58 y.o. male who presents to the [facility] pharmacy clinic for HIV follow-up.  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   ASPIRIN 81 MG EC TABLET    Take 1 tablet (81 mg total) by mouth daily in the morning.   ATORVASTATIN (LIPITOR) 40 MG TABLET    Take 1 tablet (40 mg total) by mouth daily.   BICTEGRAVIR-EMTRICITABINE-TENOFOVIR AF (BIKTARVY) 50-200-25 MG TABS TABLET    Take 1 tablet by mouth daily.   CARVEDILOL PO    Take 1 tablet by mouth 2 (two) times daily.   ENALAPRIL MALEATE PO    Take 1 tablet by mouth daily.   FLUTICASONE-SALMETEROL (ADVAIR DISKUS IN)    Inhale 1 puff into the lungs every morning.   HYDROCHLOROTHIAZIDE PO    Take 1 tablet by mouth daily.   MELOXICAM (MOBIC) 15 MG TABLET    Take 1 tablet (15 mg total) by mouth daily.   METFORMIN HCL PO    Take 1 tablet by mouth daily.   METOCLOPRAMIDE (REGLAN) 10 MG TABLET    Take 1 tablet (10 mg total) by mouth every 6 (six) hours.   OVER THE COUNTER MEDICATION    Take 2 tablets by mouth daily as needed. For Acid reflux   OXYCODONE-ACETAMINOPHEN (PERCOCET/ROXICET) 5-325 MG TABLET    Take 1 tablet by mouth 3 (three) times daily as needed for pain.   PANTOPRAZOLE  (PROTONIX) 20 MG TABLET    Take 1 tablet (20 mg total) by mouth daily.   PRESCRIPTION MEDICATION    Take 1 tablet by mouth 2 (two) times daily as needed. For panic attacks   PRESCRIPTION MEDICATION    Take 1 tablet by mouth daily.   ROSUVASTATIN (CRESTOR) 20 MG TABLET    Take 1 tablet (20 mg total) by mouth at bedtime.  *Discontinue atorvastatin**   TICAGRELOR (BRILINTA) 90 MG TABS TABLET    Take 1 tablet (90 mg total) by mouth 2 (two) times daily.  Modified Medications   No medications on file  Discontinued Medications   No medications on file    Labs: Lab Results  Component Value Date   HIV1RNAQUANT <20 (H) 03/17/2023   HIV1RNAQUANT Not Detected 08/10/2022   HIV1RNAQUANT 20 (H) 04/12/2022   CD4TABS 897 08/10/2022   CD4TABS 1,164 04/12/2022   CD4TABS 479 06/17/2021    RPR and STI Lab Results  Component Value Date   LABRPR NON-REACTIVE 03/17/2023   LABRPR NON-REACTIVE 06/17/2021    STI Results GC CT  06/17/2021 10:44 AM Negative  Negative     Hepatitis B Lab Results  Component Value Date   HEPBSAB NON-REACTIVE 06/17/2021   HEPBSAG NON-REACTIVE 06/17/2021   HEPBCAB NON-REACTIVE 06/17/2021   Hepatitis C Lab Results  Component Value  Date   HEPCAB NON-REACTIVE 06/17/2021   Hepatitis A Lab Results  Component Value Date   HAV NON-REACTIVE 06/17/2021   Lipids: Lab Results  Component Value Date   CHOL 234 (H) 02/17/2022   TRIG 152 (H) 02/17/2022   HDL 57 02/17/2022   CHOLHDL 4.1 02/17/2022   VLDL 45 (H) 04/19/2021   LDLCALC 149 (H) 02/17/2022    Current HIV Regimen: Biktarvy  Assessment: George Joyce presents for HIV follow-up on Biktarvy. Last seen by Dr Seymour Dapper January 2025. Recent drug interaction flagged for Biktarvy and phenytoin. Phenytoin interacts with many HIV medications including Biktarvy, decreasing the levels of bictegravir, decreasing efficacy and increasing possibility of resistance. His HIV RNA has remained undetectable; last checked in January 2025.    Patient reports that he takes phenytoin 1-2 tablets per month because he thought it was supposed to be in his system but has not taken a dose in over a month. This would explain why despite filling the prescription, his RNA level has remained undetectable. Phenytoin was re-prescribed by PCP without discussion with patient, so patient assumed he should be on it prompting him to take it about once a month. Per patient, it was originally prescribed from neurologist 20-30 years ago for seizures which patient believes were related to alcohol use. He states he no longer drinks or has seizures. Will contact providers at [facility] (pain management) and [facility] (PCP) to communicate conversation about phenytoin/Biktarvy interaction and patient's not taking phenytoin currently and desire to continue Biktarvy.   Patient wishes to continue Biktarvy and is very adherent with no missed doses. He states that he does not want to continue phenytoin if it means he will have to change his HIV medication and is unclear why it was continued by PCP. Patient was initially interested in [place] but prefers daily tablet to any injections.  Non-reactive Hepatitis A & B serologies in 2023. Agrees to HBV, HAV, Shingles vaccination today. Patient agrees to STI testing today.    Plan: - Continue Biktarvy  - Will contact above providers about Dilantin-Biktarvy interactions - HBV, HAV, Shingles vaccinations today. Will be due for second doses at next visit in July.  - BMP, CBC, lipid panel today - HIV RNA (w/reflex to genotype) today - RPR, oral and urine cytologies today for gonorrhea, chlamydia - Scheduled for follow-up July 15 with Dr Jodelle Mungo Danisha Brassfield PharmD Candidate

## 2023-06-28 LAB — URINE CYTOLOGY ANCILLARY ONLY
Chlamydia: NEGATIVE
Comment: NEGATIVE
Comment: NORMAL
Neisseria Gonorrhea: NEGATIVE

## 2023-06-28 LAB — CYTOLOGY, (ORAL, ANAL, URETHRAL) ANCILLARY ONLY
Chlamydia: NEGATIVE
Comment: NEGATIVE
Comment: NORMAL
Neisseria Gonorrhea: NEGATIVE

## 2023-06-29 NOTE — Telephone Encounter (Signed)
 Spoke to nurse at [facility] regarding interaction with Dilantin and Biktarvy. Discussed either removing medication from his medication list or changing to another antizeisure medication that would not interact with Biktarvy. Nurse said that they would relay this information to the provider.

## 2023-06-29 NOTE — Telephone Encounter (Signed)
Here

## 2023-06-30 LAB — LIPID PANEL
Cholesterol: 144 mg/dL (ref <200)
HDL: 53 mg/dL (ref >=40)
LDL Cholesterol (Calc): 77 mg/dL
Non-HDL Cholesterol (Calc): 91 mg/dL (ref <130)
Total CHOL/HDL Ratio: 2.7 (calc) (ref <5.0)
Triglycerides: 62 mg/dL (ref <150)

## 2023-06-30 LAB — BASIC METABOLIC PANEL WITH GFR
BUN: 12 mg/dL (ref 7–25)
CO2: 26 mmol/L (ref 20–32)
Calcium: 9.5 mg/dL (ref 8.6–10.3)
Chloride: 107 mmol/L (ref 98–110)
Creat: 1.06 mg/dL (ref 0.70–1.30)
Glucose, Bld: 118 mg/dL — ABNORMAL HIGH (ref 65–99)
Potassium: 4.2 mmol/L (ref 3.5–5.3)
Sodium: 139 mmol/L (ref 135–146)
eGFR: 81 mL/min/{1.73_m2} (ref >=60)

## 2023-06-30 LAB — RPR: RPR Ser Ql: NONREACTIVE

## 2023-06-30 LAB — CBC
HCT: 42.5 % (ref 38.5–50.0)
Hemoglobin: 14.5 g/dL (ref 13.2–17.1)
MCH: 34 pg — ABNORMAL HIGH (ref 27.0–33.0)
MCHC: 34.1 g/dL (ref 32.0–36.0)
MCV: 99.8 fL (ref 80.0–100.0)
MPV: 10.5 fL (ref 7.5–12.5)
Platelets: 223 10*3/uL (ref 140–400)
RBC: 4.26 10*6/uL (ref 4.20–5.80)
RDW: 12 % (ref 11.0–15.0)
WBC: 8.2 10*3/uL (ref 3.8–10.8)

## 2023-06-30 LAB — HIV RNA, RTPCR W/R GT (RTI, PI,INT)
HIV 1 RNA Quant: NOT DETECTED {copies}/mL
HIV-1 RNA Quant, Log: NOT DETECTED {Log_copies}/mL

## 2023-07-13 ENCOUNTER — Other Ambulatory Visit: Payer: Self-pay

## 2023-07-17 ENCOUNTER — Other Ambulatory Visit: Payer: Self-pay

## 2023-07-17 NOTE — Progress Notes (Signed)
 Specialty Pharmacy Refill Coordination Note  George Joyce is a 58 y.o. male contacted today regarding refills of specialty medication(s) Bictegravir-Emtricitab-Tenofov (Biktarvy )   Patient requested Delivery   Delivery date: 07/20/23   Verified address: [address]   [place] [address]   Medication will be filled on 05.07.25.

## 2023-08-08 ENCOUNTER — Ambulatory Visit: Attending: Physician Assistant | Admitting: Physical Therapy

## 2023-08-08 NOTE — Therapy (Incomplete)
 OUTPATIENT PHYSICAL THERAPY THORACOLUMBAR HIPEVALUATION   Patient Name: George Joyce MRN: [id number] DOB:1965-12-07, 58 y.o., male Today's Date: 08/08/2023  END OF SESSION:   Past Medical History:  Diagnosis Date   Anxiety    Asthma    Depression    Diabetes mellitus    Dyslipidemia    GERD (gastroesophageal reflux disease)    Hypertension    Panic attack    Seizures (HCC)    Past Surgical History:  Procedure Laterality Date   IR CT HEAD LTD  04/18/2021   IR CT HEAD LTD  04/18/2021   IR PERCUTANEOUS ART THROMBECTOMY/INFUSION INTRACRANIAL INC DIAG ANGIO  04/18/2021   RADIOLOGY WITH ANESTHESIA N/A 04/18/2021   Procedure: RADIOLOGY WITH ANESTHESIA;  Surgeon: Radiologist, Medication, MD;  Location: [facility] OR;  Service: Radiology;  Laterality: N/A;   Patient Active Problem List   Diagnosis Date Noted   Need for prophylactic vaccination and inoculation against influenza 03/17/2023   Anxiety 04/29/2022   Human immunodeficiency virus (HIV) disease (HCC) 06/17/2021   Screening examination for venereal disease 06/17/2021   Embolic stroke involving carotid artery (HCC) 04/18/2021   Middle cerebral artery embolism, 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  ONSET DATE: ***  SUBJECTIVE:                                                                                                                                                                                           SUBJECTIVE STATEMENT: ***  PERTINENT HISTORY:  ***  PAIN:  Are you having pain? Yes: NPRS scale: *** Pain  location: *** Pain description: *** Aggravating factors: *** Relieving factors: ***  PRECAUTIONS: {Therapy precautions:24002}  RED FLAGS: {PT Red Flags:29287}   WEIGHT BEARING RESTRICTIONS: {Yes ***/No:24003}  FALLS:  Has patient fallen in last 6 months? {fallsyesno:27318}  LIVING ENVIRONMENT: Lives with: {OPRC lives with:25569::"lives with their family"} Lives in: {Lives in:25570} Stairs: {opstairs:27293} Has following equipment at home: {Assistive devices:23999}  OCCUPATION: ***  PLOF: {PLOF:24004}  PATIENT GOALS: ***  NEXT MD VISIT: ***  OBJECTIVE:  Note: Objective measures were completed at Evaluation unless otherwise noted.  DIAGNOSTIC FINDINGS:  ***  PATIENT SURVEYS:  {rehab surveys:24030}  COGNITION: Overall cognitive status: {cognition:24006}     SENSATION: {sensation:27233}  MUSCLE LENGTH:  Hamstrings: Right *** deg; Left *** deg Andy Bannister test: Right *** deg; Left *** deg  POSTURE: {posture:25561}  PALPATION: ***  LUMBAR ROM:   AROM eval  Flexion   Extension   Right lateral flexion   Left lateral flexion   Right rotation   Left rotation   Key: WFL = within functional limits not formally assessed, * = concordant pain, s = stiffness/stretching sensation, NT = not tested)   LOWER EXTREMITY ROM:     {AROM/PROM:27142}  Right eval Left eval  Hip flexion    Hip extension    Hip abduction    Hip adduction    Hip internal rotation    Hip external rotation    Knee flexion    Knee extension    Ankle dorsiflexion    Ankle plantarflexion    Ankle inversion    Ankle eversion    Key: WFL = within functional limits not formally assessed, * = concordant pain, s = stiffness/stretching sensation, NT = not tested)  LOWER EXTREMITY MMT:    MMT Right eval Left eval  Hip flexion    Hip extension    Hip abduction    Hip adduction    Hip internal rotation    Hip external rotation    Knee flexion    Knee extension    Ankle dorsiflexion     Ankle plantarflexion    Ankle inversion    Ankle eversion    (Blank rows = not tested, score listed is out of 5 possible points.  N = WNL, D = diminished, C = clear for gross weakness with myotome testing, * = concordant pain with testing)   LUMBAR SPECIAL TESTS:  {lumbar special test:25242}  FUNCTIONAL TESTS:  {Functional tests:24029}  GAIT: Distance walked: *** Assistive device utilized: {Assistive devices:23999} Level of assistance: {Levels of assistance:24026} Comments: ***  TREATMENT DATE: ***                                                                                                                                 PATIENT EDUCATION:  Education details: *** Person educated: {Person educated:25204} Education method: {Education Method:25205} Education comprehension: {Education Comprehension:25206}  HOME EXERCISE PROGRAM: ***  ASSESSMENT:  CLINICAL IMPRESSION: Patient is a 58 y.o. male who was seen today for physical therapy evaluation and treatment for ***.   OBJECTIVE IMPAIRMENTS: {opptimpairments:25111}.   ACTIVITY LIMITATIONS: {activitylimitations:27494}  PARTICIPATION LIMITATIONS: {participationrestrictions:25113}  PERSONAL FACTORS: {Personal factors:25162} are also affecting patient's functional outcome.   REHAB POTENTIAL: {rehabpotential:25112}  CLINICAL DECISION MAKING: {clinical decision making:25114}  EVALUATION COMPLEXITY: {Evaluation complexity:25115}   GOALS: Goals reviewed with patient? {yes/no:20286}  SHORT TERM GOALS: Target date: 08-31-23  *** Baseline: Goal status: INITIAL  2.  *** Baseline:  Goal status: INITIAL  3.  *** Baseline:  Goal status: INITIAL  4.  *** Baseline:  Goal status: INITIAL  5.  *** Baseline:  Goal status: INITIAL  6.  *** Baseline:  Goal status: INITIAL  LONG TERM GOALS: Target date: 09-21-23  *** Baseline:  Goal status: INITIAL  2.  *** Baseline:  Goal status: INITIAL  3.   *** Baseline:  Goal status: INITIAL  4.  *** Baseline:  Goal status: INITIAL  5.  *** Baseline:  Goal status: INITIAL  6.  *** Baseline:  Goal status: INITIAL  PLAN:  PT FREQUENCY: {rehab frequency:25116}  PT DURATION: {rehab duration:25117}  PLANNED INTERVENTIONS: {rehab planned interventions:25118::"97110-Therapeutic exercises","97530- Therapeutic [phone]- Neuromuscular re-education","97535- Self OZDG","64403- Manual therapy"}.  PLAN FOR NEXT SESSION: Evern Hocking, PT 08/08/2023, 8:33 AM

## 2023-08-09 ENCOUNTER — Other Ambulatory Visit: Payer: Self-pay

## 2023-08-14 ENCOUNTER — Other Ambulatory Visit: Payer: Self-pay | Admitting: Pharmacy Technician

## 2023-08-14 ENCOUNTER — Other Ambulatory Visit: Payer: Self-pay

## 2023-08-14 NOTE — Progress Notes (Signed)
 Specialty Pharmacy Refill Coordination Note  George Joyce is a 58 y.o. male contacted today regarding refills of specialty medication(s) Bictegravir-Emtricitab-Tenofov (Biktarvy )   Patient requested Delivery   Delivery date: 08/17/23   Verified address: [address]   [place]   Medication will be filled on 08/16/23.

## 2023-09-13 ENCOUNTER — Other Ambulatory Visit (HOSPITAL_COMMUNITY): Payer: Self-pay

## 2023-09-13 ENCOUNTER — Other Ambulatory Visit: Payer: Self-pay

## 2023-09-13 NOTE — Progress Notes (Signed)
 Specialty Pharmacy Refill Coordination Note  Spoke with George Joyce (Self)   George Joyce is a 58 y.o. male contacted today regarding refills of specialty medication(s) Bictegravir-Emtricitab-Tenofov (Biktarvy )  Patient requested: Delivery   Delivery date: 09/14/23   Verified address: [address]  [place] [address]  Medication will be filled on 09/13/23.

## 2023-09-26 ENCOUNTER — Ambulatory Visit: Admitting: Internal Medicine

## 2023-09-26 ENCOUNTER — Other Ambulatory Visit (HOSPITAL_COMMUNITY)
Admission: RE | Admit: 2023-09-26 | Discharge: 2023-09-26 | Disposition: A | Discharge status: Home / Self Care | Source: Ambulatory Visit | Attending: Internal Medicine | Admitting: Internal Medicine

## 2023-09-26 ENCOUNTER — Encounter: Payer: Self-pay | Admitting: Internal Medicine

## 2023-09-26 ENCOUNTER — Other Ambulatory Visit: Payer: Self-pay

## 2023-09-26 VITALS — BP 149/98 | HR 73 | Temp 97.5°F | Ht 67.0 in | Wt 185.0 lb

## 2023-09-26 DIAGNOSIS — Z113 Encounter for screening for infections with a predominantly sexual mode of transmission: Secondary | ICD-10-CM | POA: Insufficient documentation

## 2023-09-26 DIAGNOSIS — A549 Gonococcal infection, unspecified: Secondary | ICD-10-CM | POA: Diagnosis not present

## 2023-09-26 DIAGNOSIS — B20 Human immunodeficiency virus [HIV] disease: Secondary | ICD-10-CM | POA: Diagnosis not present

## 2023-09-26 MED ORDER — CEFTRIAXONE SODIUM 500 MG IJ SOLR
500.0000 mg | Freq: Once | INTRAMUSCULAR | Status: AC
  Administered 2023-09-26: 500 mg via INTRAMUSCULAR

## 2023-09-26 MED ORDER — DOXYCYCLINE HYCLATE 100 MG PO TABS: 100.0000 mg | ORAL_TABLET | Freq: Two times a day (BID) | ORAL | 0 refills | Status: AC

## 2023-09-26 NOTE — Addendum Note (Signed)
 Addended by: Mylasia Vorhees M on: 09/26/2023 12:58 PM   Modules accepted: Orders

## 2023-09-26 NOTE — Progress Notes (Signed)
   Subjective:    Patient ID: George Joyce, male    DOB: [birth year], 58 y.o.   MRN: [id number]  HPI Faheem is here for follow-up of HIV He has continued on Biktarvy  and denies any missed doses.  He is doing well with this and has had no issues getting or taking his medications.  He does continue to smoke cigarettes.  He continues to follow his primary care physician as well as pain specialist.  No complaints today.   09/26/23 id clinic f/u Patient had sexual exposure with a male who told him that her previous partner had std and had to have 2 shots in the buttock Sexual exposure was 4 days ago He thinks he remembers being told that this was gonorrhea He is unclear if he had penile discharge No fever chill No rash  Patient said he never had syphilis   No other health concern today  Compliant with biktarvy  no missed dose last 4 weeks He has been well controlled on hiv for at least a year    Review of Systems  Constitutional:  Negative for fatigue.  Gastrointestinal:  Negative for diarrhea.  Skin:  Negative for rash.       Objective:   Physical Exam Eyes:     General: No scleral icterus. Pulmonary:     Effort: Pulmonary effort is normal.  Neurological:     Mental Status: He is alert.     Labs: Lab Results  Component Value Date   WBC 8.2 06/27/2023   HGB 14.5 06/27/2023   HCT 42.5 06/27/2023   MCV 99.8 06/27/2023   PLT 223 06/27/2023   Last metabolic panel Lab Results  Component Value Date   GLUCOSE 118 (H) 06/27/2023   NA 139 06/27/2023   K 4.2 06/27/2023   CL 107 06/27/2023   CO2 26 06/27/2023   BUN 12 06/27/2023   CREATININE 1.06 06/27/2023   EGFR 81 06/27/2023   CALCIUM  9.5 06/27/2023   PHOS 2.5 04/18/2021   PROT 7.2 02/17/2022   ALBUMIN 3.9 04/18/2021   BILITOT 0.2 02/17/2022   ALKPHOS 48 04/18/2021   AST 18 02/17/2022   ALT 16 02/17/2022   ANIONGAP 11 04/19/2021            Assessment & Plan:   #hiv -dx'ed 2023;  heterosexual -cd4 nadir 479 (27%) -therapy Started biktarvy  at dx 06/2021    -discussed u=u -encourage compliance -continue current HIV medication -labs hiv components 6 months -f/u in 6 months   #std testing Potential exposure 3-4 days prior to 09/26/23 He wants treatment Discussed if syphilis might take a little longer to detect  -rpr and urine gc/chlam -ceftriaxone /doxycycline  abx today -repeat testing in 2-4 weeks for syphilis and orders placed

## 2023-09-26 NOTE — Patient Instructions (Addendum)
 Ceftriaxone  shot today Pick up doxycycline  and take 1 tablet twice a day for 7 days   Urine/blood test today   Please redo blood test and urine test in around 2-4 weeks. (Labs ordered and just let the front desk know about this)   See me in 6 months for hiv retesting   Smoking Cessation: QuitlineNC 1-800-QUIT-NOW 780-315-9882); Espaol: 1-855-Djelo-Ya (8-144-664-6430) http://carroll-castaneda.info/    Mental Health Resources  988: can call or text 24/7  [facility]: Address: [address] [place], [place] [address] Open 24 hours Phone: [phone]  [facility]: Address: [address], [place], [place] [address] Phone: [phone] Appointments: [email or web address]

## 2023-09-27 LAB — RPR: RPR Ser Ql: NONREACTIVE

## 2023-09-27 LAB — URINE CYTOLOGY ANCILLARY ONLY
Chlamydia: NEGATIVE
Comment: NEGATIVE
Comment: NEGATIVE
Comment: NORMAL
Neisseria Gonorrhea: NEGATIVE
Trichomonas: NEGATIVE

## 2023-09-28 ENCOUNTER — Ambulatory Visit: Payer: Self-pay | Admitting: Internal Medicine

## 2023-10-11 ENCOUNTER — Other Ambulatory Visit: Payer: Self-pay

## 2023-10-13 ENCOUNTER — Other Ambulatory Visit: Payer: Self-pay

## 2023-10-24 ENCOUNTER — Ambulatory Visit (HOSPITAL_COMMUNITY): Admission: EM | Admit: 2023-10-24 | Discharge: 2023-10-24 | Disposition: A | Discharge status: Home / Self Care

## 2023-10-24 ENCOUNTER — Encounter (HOSPITAL_COMMUNITY): Payer: Self-pay | Admitting: *Deleted

## 2023-10-24 DIAGNOSIS — B9789 Other viral agents as the cause of diseases classified elsewhere: Secondary | ICD-10-CM | POA: Diagnosis not present

## 2023-10-24 DIAGNOSIS — J988 Other specified respiratory disorders: Secondary | ICD-10-CM | POA: Diagnosis not present

## 2023-10-24 DIAGNOSIS — I10 Essential (primary) hypertension: Secondary | ICD-10-CM | POA: Diagnosis not present

## 2023-10-24 DIAGNOSIS — R051 Acute cough: Secondary | ICD-10-CM | POA: Diagnosis not present

## 2023-10-24 LAB — POC SARS CORONAVIRUS 2 AG -  ED: SARS Coronavirus 2 Ag: NEGATIVE

## 2023-10-24 MED ORDER — PROMETHAZINE-DM 6.25-15 MG/5ML PO SYRP: 5.0000 mL | ORAL_SOLUTION | Freq: Every evening | ORAL | 0 refills | Status: AC | PRN

## 2023-10-24 MED ORDER — BENZONATATE 100 MG PO CAPS: 100.0000 mg | ORAL_CAPSULE | Freq: Three times a day (TID) | ORAL | 0 refills | Status: AC

## 2023-10-24 MED ORDER — AZELASTINE HCL 0.1 % NA SOLN: 2.0000 | Freq: Two times a day (BID) | NASAL | 0 refills | Status: AC

## 2023-10-24 NOTE — ED Provider Notes (Signed)
 [facility]    CSN: [id number] Arrival date & time: 10/24/23  1754      History   Chief Complaint Chief Complaint  Patient presents with   Nasal Congestion   Dizziness   Fatigue   Chills    HPI George Joyce is a 58 y.o. male.   Patient presents with congestion, cough, chills, fatigue, and mild dizziness that began yesterday.  Patient states that he did have 1 episode of vomiting after coughing yesterday.  Patient denies persistent nausea or continued vomiting.  Denies chest pain, shortness of breath, known fever, diarrhea, abdominal pain.    Patient states that he has taken some over-the-counter cough medications with some relief.  Patient states that he was recently exposed to someone with pneumonia.  Patient denies any other known sick contacts.  Patient states that he has also been using his albuterol  inhaler with minimal relief of his congestion.  Patient does have a history of asthma.  Patient blood pressure is elevated in clinic today.  Patient does have a history of hypertension.  Patient states he does take his blood pressure medication daily as prescribed.  Patient states that he did see his pain medicine doctor today and his blood pressure was elevated during this appointment.  Patient states that they did do a chest x-ray on him today while he was there, but reports that he has not received the results yet.   The history is provided by the patient and medical records.  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   Middle cerebral artery embolism, 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   Tobacco use disorder 01/22/2018   Hypertension associated with diabetes (HCC) 09/30/2015   Type 2 diabetes mellitus without complication, without long-term current use of insulin  (HCC) 09/30/2015    Past Surgical History:  Procedure Laterality Date   IR CT HEAD LTD  04/18/2021   IR CT HEAD LTD  04/18/2021   IR PERCUTANEOUS ART THROMBECTOMY/INFUSION INTRACRANIAL INC DIAG ANGIO  04/18/2021   RADIOLOGY WITH ANESTHESIA N/A 04/18/2021   Procedure: RADIOLOGY WITH ANESTHESIA;  Surgeon: Radiologist, Medication, MD;  Location: [facility] OR;  Service: Radiology;  Laterality: N/A;       Home Medications    Prior to Admission medications   Medication Sig Start Date End Date Taking? Authorizing Provider  albuterol  (VENTOLIN  HFA) 108 (90 Base) MCG/ACT inhaler Inhale 2 puffs into the lungs every 6 (six) hours as needed. 02/17/22  Yes Orlando Burnard SAUNDERS, PA-C  aspirin  81 MG EC tablet Take 1 tablet (81 mg total) by mouth daily in the morning. 05/20/21  Yes   atorvastatin  (LIPITOR) 40 MG tablet Take 1 tablet (40 mg total) by mouth daily. 04/19/21  Yes Jerri Pfeiffer, MD  azelastine  (ASTELIN ) 0.1 % nasal spray Place 2 sprays into both nostrils 2 (two) times daily. Use in each nostril as directed 10/24/23  Yes Johnie Flaming A, NP  benzonatate  (TESSALON ) 100 MG capsule Take  1 capsule (100 mg total) by mouth every 8 (eight) hours. 10/24/23  Yes Johnie, Nakai Yard A, NP  bictegravir-emtricitabine -tenofovir  AF (BIKTARVY ) 50-200-25 MG TABS tablet Take 1 tablet by mouth daily. 01/23/23  Yes Orlando Sor R, PA-C  CARVEDILOL PO Take 1 tablet by mouth 2 (two) times daily.   Yes [provider]  ENALAPRIL MALEATE PO Take 1 tablet by mouth daily.   Yes [provider]  gabapentin (NEURONTIN) 300 MG capsule Take 300 mg by mouth 3 (three) times daily. 10/24/23  Yes [provider]   HYDROCHLOROTHIAZIDE PO Take 1 tablet by mouth daily.   Yes [provider]  KLOXXADO 8 MG/0.1ML LIQD as directed. 06/23/23  Yes [provider]  levETIRAcetam (KEPPRA) 500 MG tablet Take 500 mg by mouth 2 (two) times daily. 06/29/23  Yes [provider]  METFORMIN HCL PO Take 1 tablet by mouth daily.   Yes [provider]  omeprazole (PRILOSEC) 20 MG capsule Take 20 mg by mouth daily. 10/24/23  Yes [provider]  oxyCODONE-acetaminophen  (PERCOCET/ROXICET) 5-325 MG tablet Take 1 tablet by mouth 3 (three) times daily as needed for pain. 04/02/21  Yes [provider]  promethazine -dextromethorphan (PROMETHAZINE -DM) 6.25-15 MG/5ML syrup Take 5 mLs by mouth at bedtime as needed for cough. 10/24/23  Yes Johnie, Daison Braxton A, NP  rosuvastatin  (CRESTOR ) 20 MG tablet Take 1 tablet (20 mg total) by mouth at bedtime.  *Discontinue atorvastatin ** 05/20/21  Yes   Fluticasone-Salmeterol (ADVAIR DISKUS IN) Inhale 1 puff into the lungs every morning.    [provider]  meloxicam  (MOBIC ) 15 MG tablet Take 1 tablet (15 mg total) by mouth daily. 05/28/21     metoCLOPramide  (REGLAN ) 10 MG tablet Take 1 tablet (10 mg total) by mouth every 6 (six) hours. 10/02/11 09/26/23  Nguyen, Brigitte S, PA-C  OVER THE COUNTER MEDICATION Take 2 tablets by mouth daily as needed. For Acid reflux    [provider]  pantoprazole  (PROTONIX ) 20 MG tablet Take 1 tablet (20 mg total) by mouth daily. 10/02/11 09/26/23  Nguyen, Brigitte S, PA-C  PRESCRIPTION MEDICATION Take 1 tablet by mouth 2 (two) times daily as needed. For panic attacks    [provider]  PRESCRIPTION MEDICATION Take 1 tablet by mouth daily.    [provider]  ticagrelor  (BRILINTA ) 90 MG TABS tablet Take 1 tablet (90 mg total) by mouth 2 (two) times daily. 05/20/21       Family History History reviewed. No pertinent family history.  Social History Social History   Tobacco Use    Smoking status: Every Day    Current packs/day: 1.00    Types: Cigarettes   Tobacco comments:    Smokes a couple of cigarettes a day, trying to cut back  Vaping Use   Vaping status: Never Used  Substance Use Topics   Alcohol use: No   Drug use: Not Currently     Allergies   Aspirin , Pineapple, Pneumococcal vaccine, and Penicillins   Review of Systems Review of Systems  Neurological:  Positive for dizziness.   Per HPI  Physical Exam Triage Vital Signs ED Triage Vitals  Encounter Vitals Group     BP 10/24/23 1836 (!) 170/102     Girls Systolic BP Percentile --      Girls Diastolic BP Percentile --      Boys Systolic BP Percentile --      Boys Diastolic BP Percentile --      Pulse Rate 10/24/23 1836 76  Resp 10/24/23 1836 18     Temp 10/24/23 1836 98.1 F (36.7 C)     Temp Source 10/24/23 1836 Oral     SpO2 10/24/23 1836 96 %     Weight --      Height --      Head Circumference --      Peak Flow --      Pain Score 10/24/23 1831 7     Pain Loc --      Pain Education --      Exclude from Growth Chart --    No data found.  Updated Vital Signs BP (!) 170/102 (BP Location: Right Arm)   Pulse 76   Temp 98.1 F (36.7 C) (Oral)   Resp 18   SpO2 96%   Visual Acuity Right Eye Distance:   Left Eye Distance:   Bilateral Distance:    Right Eye Near:   Left Eye Near:    Bilateral Near:     Physical Exam Vitals and nursing note reviewed.  Constitutional:      General: He is awake. He is not in acute distress.    Appearance: Normal appearance. He is well-developed and well-groomed. He is not ill-appearing.  HENT:     Right Ear: Tympanic membrane, ear canal and external ear normal.     Left Ear: Tympanic membrane, ear canal and external ear normal.     Nose: Congestion and rhinorrhea present.     Mouth/Throat:     Mouth: Mucous membranes are moist.     Pharynx: Posterior oropharyngeal erythema and postnasal drip present. No oropharyngeal exudate.   Cardiovascular:     Rate and Rhythm: Normal rate and regular rhythm.  Pulmonary:     Effort: Pulmonary effort is normal.     Breath sounds: Normal breath sounds.  Skin:    General: Skin is warm and dry.  Neurological:     Mental Status: He is alert.  Psychiatric:        Behavior: Behavior is cooperative.      UC Treatments / Results  Labs (all labs ordered are listed, but only abnormal results are displayed) Labs Reviewed  POC SARS CORONAVIRUS 2 AG -  ED    EKG   Radiology No results found.  Procedures Procedures (including critical care time)  Medications Ordered in UC Medications - No data to display  Initial Impression / Assessment and Plan / UC Course  I have reviewed the triage vital signs and the nursing notes.  Pertinent labs & imaging results that were available during my care of the patient were reviewed by me and considered in my medical decision making (see chart for details).     Patient is overall well-appearing.  Vitals are stable.  Blood pressure is elevated at 170/102.  Congestion and rhinorrhea are present, mild erythema and PND noted to pharynx.  Lungs clear bilaterally to auscultation.  COVID testing negative.  Symptoms likely viral in nature.  Prescribed Tessalon  and Promethazine  DM as needed for cough.  Prescribed azelastine  as needed for congestion.  Discussed over-the-counter medications as needed for symptoms.  Discussed when to use albuterol  inhaler.  Discussed importance of taking blood pressure medication.  Discussed follow-up and return precautions Final Clinical Impressions(s) / UC Diagnoses   Final diagnoses:  Acute cough  Viral respiratory illness  Elevated blood pressure reading in office with diagnosis of hypertension     Discharge Instructions      Your COVID testing was negative today.  As discussed I believe her symptoms are likely related to a viral respiratory illness. You can take Tessalon  every 8 hours as needed for  cough. You can take Promethazine  DM cough syrup at bedtime as needed for cough.  This can make you drowsy so do not drive, work, or drink alcohol while taking this. You can use azelastine  nasal spray twice daily to help with congestion. Otherwise you can take over-the-counter Mucinex to help with cough and congestion. You can take 650 mg of Tylenol  every 6-8 hours as needed for body aches, sore throat, or fever. You can continue to use your albuterol  inhaler every 6 hours as needed for any shortness of breath if needed.  Be sure that you are taking your blood pressure medication daily as prescribed.  If you continue to have elevated blood pressure readings recommend following up with your primary care provider for further evaluation of this. Follow-up with your primary care provider or return here as needed.   ED Prescriptions     Medication Sig Dispense Auth. Provider   benzonatate  (TESSALON ) 100 MG capsule Take 1 capsule (100 mg total) by mouth every 8 (eight) hours. 21 capsule Johnie Flaming A, NP   promethazine -dextromethorphan (PROMETHAZINE -DM) 6.25-15 MG/5ML syrup Take 5 mLs by mouth at bedtime as needed for cough. 118 mL Johnie Flaming A, NP   azelastine  (ASTELIN ) 0.1 % nasal spray Place 2 sprays into both nostrils 2 (two) times daily. Use in each nostril as directed 30 mL Johnie Flaming A, NP      PDMP not reviewed this encounter.   Johnie Flaming A, NP 10/24/23 1928

## 2023-10-24 NOTE — Discharge Instructions (Signed)
 Your COVID testing was negative today.  As discussed I believe her symptoms are likely related to a viral respiratory illness. You can take Tessalon  every 8 hours as needed for cough. You can take Promethazine  DM cough syrup at bedtime as needed for cough.  This can make you drowsy so do not drive, work, or drink alcohol while taking this. You can use azelastine  nasal spray twice daily to help with congestion. Otherwise you can take over-the-counter Mucinex to help with cough and congestion. You can take 650 mg of Tylenol  every 6-8 hours as needed for body aches, sore throat, or fever. You can continue to use your albuterol  inhaler every 6 hours as needed for any shortness of breath if needed.  Be sure that you are taking your blood pressure medication daily as prescribed.  If you continue to have elevated blood pressure readings recommend following up with your primary care provider for further evaluation of this. Follow-up with your primary care provider or return here as needed.

## 2023-10-24 NOTE — ED Triage Notes (Signed)
 Pt states he has dizziness, congestion, fatigue, chills, and vomiting yesterday. States he has been taking some OTC cough meds. He has been exposed to someone who was dx with pneumonia. He has used his albuterol  MDI without relief.

## 2023-11-27 ENCOUNTER — Other Ambulatory Visit: Payer: Self-pay

## 2023-11-27 ENCOUNTER — Other Ambulatory Visit (HOSPITAL_COMMUNITY): Payer: Self-pay

## 2023-11-27 NOTE — Progress Notes (Signed)
 Called patient to initiate refills received the rejection below spoke to patient and he informed me that he is now receiving his Biktarvy  medication from [facility] and would like to continue there. Due to that factor I will be dis enrolling the patient from [facility].

## 2023-12-11 ENCOUNTER — Ambulatory Visit: Admitting: Podiatry

## 2023-12-19 ENCOUNTER — Telehealth: Payer: Self-pay

## 2023-12-19 NOTE — Telephone Encounter (Signed)
 Received medical case management referral from [facility]. Called patient, no answer. Left HIPAA compliant voicemail requesting callback. Called home number, call could not be completed.   Sonja Manseau, BSN, RN

## 2024-02-12 NOTE — Progress Notes (Signed)
 The ASCVD Risk score (Arnett DK, et al., 2019) failed to calculate for the following reasons:   Risk score cannot be calculated because patient has a medical history suggesting prior/existing ASCVD  Arlon Bergamo, BSN, RN

## 2024-03-15 ENCOUNTER — Telehealth: Payer: Self-pay

## 2024-03-15 NOTE — Telephone Encounter (Signed)
 Shaletha called requesting to speak to [facility] regarding patient - patient has onset dementia and doesn't want daughters to know he has HIV. Thressa will be on vacation next week. Patient is scheduled to come in 1/6 via uber. Shaletha wanted to see if you could follow up with client on regular basis.   Henry Demeritt SHAUNNA Letters, CMA

## 2024-03-18 NOTE — Telephone Encounter (Signed)
 Patient's appointment was rescheduled to 1/30. Will plan to discuss with [facility] when she returns.   Kamaron Deskins, BSN, RN

## 2024-03-19 ENCOUNTER — Ambulatory Visit: Admitting: Internal Medicine

## 2024-03-19 DIAGNOSIS — Z122 Encounter for screening for malignant neoplasm of respiratory organs: Secondary | ICD-10-CM

## 2024-03-19 DIAGNOSIS — F17200 Nicotine dependence, unspecified, uncomplicated: Secondary | ICD-10-CM

## 2024-03-29 ENCOUNTER — Inpatient Hospital Stay: Admission: RE | Admit: 2024-03-29 | Source: Ambulatory Visit

## 2024-04-12 ENCOUNTER — Other Ambulatory Visit: Payer: Self-pay

## 2024-04-12 ENCOUNTER — Encounter: Payer: Self-pay | Admitting: Internal Medicine

## 2024-04-12 ENCOUNTER — Ambulatory Visit: Admitting: Internal Medicine

## 2024-04-12 VITALS — BP 169/110 | HR 84 | Temp 97.6°F | Ht 67.0 in | Wt 195.0 lb

## 2024-04-12 DIAGNOSIS — Z111 Encounter for screening for respiratory tuberculosis: Secondary | ICD-10-CM

## 2024-04-12 DIAGNOSIS — Z79899 Other long term (current) drug therapy: Secondary | ICD-10-CM | POA: Diagnosis not present

## 2024-04-12 DIAGNOSIS — B20 Human immunodeficiency virus [HIV] disease: Secondary | ICD-10-CM

## 2024-04-12 DIAGNOSIS — R030 Elevated blood-pressure reading, without diagnosis of hypertension: Secondary | ICD-10-CM | POA: Diagnosis not present

## 2024-04-12 DIAGNOSIS — Z23 Encounter for immunization: Secondary | ICD-10-CM | POA: Diagnosis not present

## 2024-04-12 DIAGNOSIS — Z113 Encounter for screening for infections with a predominantly sexual mode of transmission: Secondary | ICD-10-CM | POA: Diagnosis not present

## 2024-04-12 MED ORDER — BICTEGRAVIR-EMTRICITAB-TENOFOV 50-200-25 MG PO TABS: 1.0000 | ORAL_TABLET | Freq: Every day | ORAL | 11 refills | Status: AC

## 2024-04-12 MED ORDER — ROSUVASTATIN CALCIUM 20 MG PO TABS: 20.0000 mg | ORAL_TABLET | Freq: Every day | ORAL | 11 refills | Status: AC

## 2024-04-12 NOTE — Patient Instructions (Signed)
 Vaccine: Hepatitis b Flu Shingle    In 2 months have another nurse visit for hep b and shingle 2nd shot    Continue biktarvy     See me 02/2025

## 2024-04-12 NOTE — Addendum Note (Signed)
 Addended by: OVERTON FAITH T on: 04/12/2024 10:20 AM   Modules accepted: Orders

## 2024-04-12 NOTE — Addendum Note (Signed)
 Addended by: GRETEL TULLY HERO on: 04/12/2024 10:31 AM   Modules accepted: Orders

## 2024-04-12 NOTE — Addendum Note (Signed)
 Addended by: CELESTIA LELA HERO on: 04/12/2024 10:52 AM   Modules accepted: Orders

## 2024-04-12 NOTE — Progress Notes (Addendum)
" ° °  Subjective:    Patient ID: George Joyce, male    DOB: 1966/02/26, 59 y.o.   MRN: [id number]  HPI Kashius is here for follow-up of HIV He has continued on Biktarvy  and denies any missed doses.  He is doing well with this and has had no issues getting or taking his medications.  He does continue to smoke cigarettes.  He continues to follow his primary care physician as well as pain specialist.  No complaints today.   09/26/23 id clinic f/u Patient had sexual exposure with a male who told him that her previous partner had std and had to have 2 shots in the buttock Sexual exposure was 4 days ago He thinks he remembers being told that this was gonorrhea He is unclear if he had penile discharge No fever chill No rash  Patient said he never had syphilis   No other health concern today  Compliant with biktarvy  no missed dose last 4 weeks He has been well controlled on hiv for at least a year   04/12/24 id clinic f/u Doing well Smoking -- 1.5 ppd thinking about quitting Sexually active no sx of std Compliant with biktarvy  last 4 weeks  Review of Systems  Constitutional:  Negative for fatigue.  Gastrointestinal:  Negative for diarrhea.  Skin:  Negative for rash.       Objective:   Physical Exam Eyes:     General: No scleral icterus. Pulmonary:     Effort: Pulmonary effort is normal.  Neurological:     Mental Status: He is alert.     Labs: Lab Results  Component Value Date   WBC 8.2 06/27/2023   HGB 14.5 06/27/2023   HCT 42.5 06/27/2023   MCV 99.8 06/27/2023   PLT 223 06/27/2023   Last metabolic panel Lab Results  Component Value Date   GLUCOSE 118 (H) 06/27/2023   NA 139 06/27/2023   K 4.2 06/27/2023   CL 107 06/27/2023   CO2 26 06/27/2023   BUN 12 06/27/2023   CREATININE 1.06 06/27/2023   EGFR 81 06/27/2023   CALCIUM  9.5 06/27/2023   PHOS 2.5 04/18/2021   PROT 7.2 02/17/2022   ALBUMIN 3.9 04/18/2021   BILITOT 0.2 02/17/2022   ALKPHOS 48  04/18/2021   AST 18 02/17/2022   ALT 16 02/17/2022   ANIONGAP 11 04/19/2021    HIV: Lab Results  Component Value Date   HIV1RNAQUANT NOT DETECTED 06/27/2023   Lab Results  Component Value Date   CD4TCELL 29 (L) 03/17/2023   CD4TABS 897 08/10/2022           Assessment & Plan:   #hiv -dx'ed 2023; heterosexual -cd4 nadir 479 (27%) -therapy Started biktarvy  at dx 06/2021  04/12/24 doing well on biktarvy . Wants to continue on it  -discussed u=u -encourage compliance -continue current HIV medication biktarvy  -labs today -f/u in 11 months   #std testing Assymptomatic Wants to get testing  -rpr and urine gc/chlam    #elevated bp Recheck at home daily   #tobacco use Precontemplative 03/2024 1.5 ppd as of 03/2024   #hcm -vaccination Utd Shingrix written; patient ryan white Flu shot -hepatitis Hep b serology negative (passive/active) in past; hep b heplisav -metabolic Discussed reprieve 04/12/24 -- patient on rosuvasttin 20 mg daily and will continue -std testing 04/12/24 urine and rpr -tb screening 04/12/24 ordered -cancer screening Defer to pcp for age appropriate cancer screening   Follow le:Mzulmw in about 11 months (around 03/12/2025).  "

## 2024-04-15 LAB — C. TRACHOMATIS/N. GONORRHOEAE RNA
C. trachomatis RNA, TMA: NOT DETECTED
N. gonorrhoeae RNA, TMA: NOT DETECTED

## 2024-04-17 LAB — HIV-1 RNA QUANT-NO REFLEX-BLD
HIV 1 RNA Quant: NOT DETECTED {copies}/mL
HIV-1 RNA Quant, Log: NOT DETECTED {Log_copies}/mL

## 2024-04-17 LAB — COMPLETE METABOLIC PANEL WITHOUT GFR
AG Ratio: 1.5 (calc) (ref 1.0–2.5)
ALT: 21 U/L (ref 9–46)
AST: 21 U/L (ref 10–35)
Albumin: 4.4 g/dL (ref 3.6–5.1)
Alkaline phosphatase (APISO): 62 U/L (ref 35–144)
BUN: 13 mg/dL (ref 7–25)
CO2: 29 mmol/L (ref 20–32)
Calcium: 10 mg/dL (ref 8.6–10.3)
Chloride: 105 mmol/L (ref 98–110)
Creat: 1.12 mg/dL (ref 0.70–1.35)
Globulin: 3 g/dL (ref 1.9–3.7)
Glucose, Bld: 101 mg/dL — ABNORMAL HIGH (ref 65–99)
Potassium: 4 mmol/L (ref 3.5–5.3)
Sodium: 138 mmol/L (ref 135–146)
Total Bilirubin: 0.4 mg/dL (ref 0.2–1.2)
Total Protein: 7.4 g/dL (ref 6.1–8.1)

## 2024-04-17 LAB — CBC
HCT: 40.5 % (ref 39.4–51.1)
Hemoglobin: 14.2 g/dL (ref 13.2–17.1)
MCH: 35.1 pg — ABNORMAL HIGH (ref 27.0–33.0)
MCHC: 35.1 g/dL (ref 31.6–35.4)
MCV: 100 fL (ref 81.4–101.7)
MPV: 10.8 fL (ref 7.5–12.5)
Platelets: 239 10*3/uL (ref 140–400)
RBC: 4.05 Million/uL — ABNORMAL LOW (ref 4.20–5.80)
RDW: 11.8 % (ref 11.0–15.0)
WBC: 9.7 10*3/uL (ref 3.8–10.8)

## 2024-04-17 LAB — QUANTIFERON-TB GOLD PLUS
Mitogen-NIL: 6.88 [IU]/mL
NIL: 0.04 [IU]/mL
QuantiFERON-TB Gold Plus: NEGATIVE
TB1-NIL: 0.01 [IU]/mL
TB2-NIL: 0.01 [IU]/mL

## 2024-04-17 LAB — T-HELPER CELLS (CD4) COUNT (NOT AT ARMC)
Absolute CD4: 1055 {cells}/uL (ref 490–1740)
CD4 T Helper %: 37 % (ref 30–61)
Total lymphocyte count: 2888 {cells}/uL (ref 850–3900)

## 2024-04-17 LAB — SYPHILIS: RPR W/REFLEX TO RPR TITER AND TREPONEMAL ANTIBODIES, TRADITIONAL SCREENING AND DIAGNOSIS ALGORITHM: RPR Ser Ql: NONREACTIVE

## 2024-06-10 ENCOUNTER — Ambulatory Visit: Payer: Self-pay

## 2025-03-12 ENCOUNTER — Ambulatory Visit: Payer: Self-pay | Admitting: Internal Medicine
